# Patient Record
Sex: Male | Born: 1970 | Race: White | Hispanic: No | Marital: Married | Smoking: Former smoker
Health system: Southern US, Community
[De-identification: ages and names within clinical notes are randomized; demographics above are authoritative.]

## PROBLEM LIST (undated history)

## (undated) DIAGNOSIS — M48061 Spinal stenosis, lumbar region without neurogenic claudication: Secondary | ICD-10-CM

## (undated) DIAGNOSIS — G8929 Other chronic pain: Secondary | ICD-10-CM

## (undated) DIAGNOSIS — I2510 Atherosclerotic heart disease of native coronary artery without angina pectoris: Secondary | ICD-10-CM

## (undated) DIAGNOSIS — M545 Low back pain, unspecified: Secondary | ICD-10-CM

## (undated) DIAGNOSIS — I10 Essential (primary) hypertension: Secondary | ICD-10-CM

## (undated) DIAGNOSIS — J302 Other seasonal allergic rhinitis: Secondary | ICD-10-CM

## (undated) DIAGNOSIS — F419 Anxiety disorder, unspecified: Secondary | ICD-10-CM

## (undated) DIAGNOSIS — Z91030 Bee allergy status: Secondary | ICD-10-CM

## (undated) HISTORY — DX: Anxiety disorder, unspecified: F41.9

---

## 2013-11-23 ENCOUNTER — Ambulatory Visit (INDEPENDENT_AMBULATORY_CARE_PROVIDER_SITE_OTHER): Payer: BC Managed Care – PPO | Admitting: Family Medicine

## 2013-11-23 ENCOUNTER — Ambulatory Visit (INDEPENDENT_AMBULATORY_CARE_PROVIDER_SITE_OTHER): Payer: BC Managed Care – PPO

## 2013-11-23 VITALS — BP 139/81 | HR 80 | Temp 98.3°F | Resp 18 | Ht 72.0 in | Wt 201.0 lb

## 2013-11-23 DIAGNOSIS — E860 Dehydration: Secondary | ICD-10-CM

## 2013-11-23 DIAGNOSIS — R0602 Shortness of breath: Secondary | ICD-10-CM

## 2013-11-23 DIAGNOSIS — M940 Chondrocostal junction syndrome [Tietze]: Secondary | ICD-10-CM

## 2013-11-23 DIAGNOSIS — F411 Generalized anxiety disorder: Secondary | ICD-10-CM

## 2013-11-23 DIAGNOSIS — R079 Chest pain, unspecified: Secondary | ICD-10-CM

## 2013-11-23 DIAGNOSIS — R319 Hematuria, unspecified: Secondary | ICD-10-CM

## 2013-11-23 DIAGNOSIS — K219 Gastro-esophageal reflux disease without esophagitis: Secondary | ICD-10-CM

## 2013-11-23 LAB — POCT URINALYSIS DIPSTICK
Bilirubin, UA: NEGATIVE
GLUCOSE UA: NEGATIVE
Leukocytes, UA: NEGATIVE
Nitrite, UA: NEGATIVE
PROTEIN UA: NEGATIVE
Spec Grav, UA: 1.02
Urobilinogen, UA: 0.2
pH, UA: 5.5

## 2013-11-23 LAB — POCT SEDIMENTATION RATE: POCT SED RATE: 18 mm/hr (ref 0–22)

## 2013-11-23 LAB — POCT CBC
Granulocyte percent: 66.8 %G (ref 37–80)
HEMATOCRIT: 47.2 % (ref 43.5–53.7)
Hemoglobin: 15.3 g/dL (ref 14.1–18.1)
LYMPH, POC: 1.5 (ref 0.6–3.4)
MCH, POC: 29.8 pg (ref 27–31.2)
MCHC: 32.4 g/dL (ref 31.8–35.4)
MCV: 92 fL (ref 80–97)
MID (cbc): 0.4 (ref 0–0.9)
MPV: 8.7 fL (ref 0–99.8)
POC Granulocyte: 3.9 (ref 2–6.9)
POC LYMPH %: 25.9 % (ref 10–50)
POC MID %: 7.3 %M (ref 0–12)
Platelet Count, POC: 221 10*3/uL (ref 142–424)
RBC: 5.13 M/uL (ref 4.69–6.13)
RDW, POC: 12.9 %
WBC: 5.9 10*3/uL (ref 4.6–10.2)

## 2013-11-23 LAB — COMPLETE METABOLIC PANEL WITH GFR
ALT: 23 U/L (ref 0–53)
AST: 20 U/L (ref 0–37)
Albumin: 4.7 g/dL (ref 3.5–5.2)
Alkaline Phosphatase: 67 U/L (ref 39–117)
BUN: 17 mg/dL (ref 6–23)
CO2: 29 meq/L (ref 19–32)
Calcium: 9.2 mg/dL (ref 8.4–10.5)
Chloride: 102 mEq/L (ref 96–112)
Creat: 0.96 mg/dL (ref 0.50–1.35)
GFR, Est Non African American: >89 mL/min
GLUCOSE: 92 mg/dL (ref 70–99)
POTASSIUM: 4.6 meq/L (ref 3.5–5.3)
SODIUM: 140 meq/L (ref 135–145)
TOTAL PROTEIN: 7.3 g/dL (ref 6.0–8.3)
Total Bilirubin: 0.6 mg/dL (ref 0.2–1.2)

## 2013-11-23 LAB — POCT UA - MICROSCOPIC ONLY
BACTERIA, U MICROSCOPIC: NEGATIVE
CRYSTALS, UR, HPF, POC: NEGATIVE
Casts, Ur, LPF, POC: NEGATIVE
Epithelial cells, urine per micros: NEGATIVE
Mucus, UA: POSITIVE
Yeast, UA: NEGATIVE

## 2013-11-23 LAB — GLUCOSE, POCT (MANUAL RESULT ENTRY): POC GLUCOSE: 71 mg/dL (ref 70–99)

## 2013-11-23 MED ORDER — ALBUTEROL SULFATE (2.5 MG/3ML) 0.083% IN NEBU
2.5000 mg | INHALATION_SOLUTION | Freq: Once | RESPIRATORY_TRACT | Status: AC
  Administered 2013-11-23: 2.5 mg via RESPIRATORY_TRACT

## 2013-11-23 MED ORDER — HYDROCOD POLST-CHLORPHEN POLST 10-8 MG/5ML PO LQCR: 5.0000 mL | Freq: Every evening | ORAL | Status: DC | PRN

## 2013-11-23 MED ORDER — MELOXICAM 15 MG PO TABS: 15.0000 mg | ORAL_TABLET | Freq: Every day | ORAL | Status: DC

## 2013-11-23 MED ORDER — GUAIFENESIN-DM 100-10 MG/5ML PO SYRP: 5.0000 mL | ORAL_SOLUTION | ORAL | Status: DC | PRN

## 2013-11-23 MED ORDER — RANITIDINE HCL 150 MG PO TABS: 150.0000 mg | ORAL_TABLET | Freq: Two times a day (BID) | ORAL | Status: DC

## 2013-11-23 MED ORDER — CETIRIZINE HCL 10 MG PO TABS: 10.0000 mg | ORAL_TABLET | Freq: Every day | ORAL | Status: DC

## 2013-11-23 MED ORDER — BUSPIRONE HCL 10 MG PO TABS: 10.0000 mg | ORAL_TABLET | Freq: Two times a day (BID) | ORAL | Status: DC

## 2013-11-23 NOTE — Patient Instructions (Addendum)
Dehydration in Sports The body uses the kidney, bladder, and thirst mechanisms in an intricate system to maintain the proper fluid levels in the body. When this system is stressed, such as during exercise, the system may not be able to maintain these levels. This results in the body lacking water (dehydration). Dehydration can be a problem because the body requires a certain amount of water and other fluids to maintain its blood volume. Fluid is lost when you urinate, sweat, breathe, vomit, or have diarrhea. Dehydration occurs when you drink less fluid than you lose. Dehydration may occur even before you become thirsty. It is important for athletes to keep drinking during activity, even if they do not feel thirsty. SYMPTOMS   Thirst.  Dry mouth.  Tiredness (lethargy).  Dark urine.  Headache.  Muscle cramps.  Rapid breathing.  Lightheadedness, especially when you stand from a sitting position.  Dry, warm skin.  Little or no urination.  Low blood pressure.  Fainting (syncope).  Delirium or unconsciousness. RISK INCREASES WITH:  Diarrhea.  Vomiting.  Inadequate fluid intake during an illness or strenuous exercise.  Inadequate food intake during an illness, during strenuous exercise, or after strenuous exercise.  Use of diuretic medicines, which control excess body fluid by causing fluid loss.  Certain age groups. Infants and the elderly are at greater risk. PREVENTION  Drink frequently throughout physical activity even if you do not feel thirsty. Drink small amounts of fluid frequently throughout and after sporting events.  Drink extra fluids to keep up with any ongoing losses (sweating, diarrhea).  Carry extra water and the ingredients for making an oral rehydration solution (ORS).  If you have diarrhea or vomiting or you are not drinking much, force yourself to drink more liquids before you become dehydrated. RELATED COMPLICATIONS   Reduced ability to dissipate  heat, resulting in elevated core body temperatures.  Heat illness.  Heat stroke.  Kidney failure. TREATMENT Mild dehydration is treated by drinking enough fluid to replace the fluids you have lost. You may also need to replace the electrolytes you have lost. Recommendations for replenishing fluids and electrolytes include drinking sips of water slowly, eating foods with salt, drinking sports drinks, or taking over-the-counter dehydration medicines. Treat dehydration immediately. Do not wait until dehydration becomes severe.  Packets of ORS are widely available. Follow the directions on the packet. If no instructions are given, mix the contents of the ORS with 1 quart or liter of drinking water. If you are not sure if the water is safe to drink, first boil the water for at least 5 minutes.  If you are unable to obtain ORS you may create your own by adding 2 tbs of sugar or honey,  tsp salt, and  tsp baking soda to 1 quart or liter of water. If you do not have any baking soda, add another  tsp of salt. If possible, add  cup orange juice or some mashed banana to improve the taste and provide some potassium. Drink sips of the ORS every 5 minutes until urination becomes normal. It is normal to urinate 4 or 5 times a day. Adults and adolescents should drink at least 3 quarts or liters of ORS a day until they are well. For individuals who are vomiting or have diarrhea, it is important to keep trying to drink the ORS. Your body may retain some of the fluids and salts you need even if you are vomiting or have diarrhea. Remember to take only sips of liquids. Chilling   the ORS may help. Severe dehydration is a medical emergency. If you have symptoms of severe dehydration, seek medical care immediately. It may be necessary for you to receive intravenous (IV) fluids. If you are able to drink, you should also drink the ORS. With treatment for dehydration, whatever is causing diarrhea, vomiting, or other symptoms  should also be treated.  Document Released: 05/27/2005 Document Revised: 08/19/2011 Document Reviewed: 09/08/2008 [facility] Patient Information 2014 [employer], [place]. Acute Bronchitis Bronchitis is inflammation of the airways that extend from the windpipe into the lungs (bronchi). The inflammation often causes mucus to develop. This leads to a cough, which is the most common symptom of bronchitis.  In acute bronchitis, the condition usually develops suddenly and goes away over time, usually in a couple weeks. Smoking, allergies, and asthma can make bronchitis worse. Repeated episodes of bronchitis may cause further lung problems.  CAUSES Acute bronchitis is most often caused by the same virus that causes a cold. The virus can spread from person to person (contagious).  SIGNS AND SYMPTOMS   Cough.   Fever.   Coughing up mucus.   Body aches.   Chest congestion.   Chills.   Shortness of breath.   Sore throat.  DIAGNOSIS  Acute bronchitis is usually diagnosed through a physical exam. Tests, such as chest X-rays, are sometimes done to rule out other conditions.  TREATMENT  Acute bronchitis usually goes away in a couple weeks. Often times, no medical treatment is necessary. Medicines are sometimes given for relief of fever or cough. Antibiotics are usually not needed but may be prescribed in certain situations. In some cases, an inhaler may be recommended to help reduce shortness of breath and control the cough. A cool mist vaporizer may also be used to help thin bronchial secretions and make it easier to clear the chest.  HOME CARE INSTRUCTIONS  Get plenty of rest.   Drink enough fluids to keep your urine clear or pale yellow (unless you have a medical condition that requires fluid restriction). Increasing fluids may help thin your secretions and will prevent dehydration.   Only take over-the-counter or prescription medicines as directed by your health care Dailynn Nancarrow.   Avoid  smoking and secondhand smoke. Exposure to cigarette smoke or irritating chemicals will make bronchitis worse. If you are a smoker, consider using nicotine gum or skin patches to help control withdrawal symptoms. Quitting smoking will help your lungs heal faster.   Reduce the chances of another bout of acute bronchitis by washing your hands frequently, avoiding people with cold symptoms, and trying not to touch your hands to your mouth, nose, or eyes.   Follow up with your health care Coti Burd as directed.  SEEK MEDICAL CARE IF: Your symptoms do not improve after 1 week of treatment.  SEEK IMMEDIATE MEDICAL CARE IF:  You develop an increased fever or chills.   You have chest pain.   You have severe shortness of breath.  You have bloody sputum.   You develop dehydration.  You develop fainting.  You develop repeated vomiting.  You develop a severe headache. MAKE SURE YOU:   Understand these instructions.  Will watch your condition.  Will get help right away if you are not doing well or get worse. Document Released: 07/04/2004 Document Revised: 01/27/2013 Document Reviewed: 11/17/2012 [facility] Patient Information 2014 [employer], [place].

## 2013-11-23 NOTE — Progress Notes (Deleted)
Subjective:   This chart was scribed for Timothy Sorenson, MD, by Timothy Charles, ED Scribe. This patient's care was started at 11:14 AM.    Patient ID: Timothy Charles, male    DOB: [birth year], 43 y.o.   MRN: [id number]  Chief Complaint  Patient presents with  . sore in middle of chest    x1 week  . Anxiety    last night     HPI  Timothy Charles is a 43 y.o. male who presents to [facility] complaining of thirty minutes of chest pain which occurred yesterday evening as the pt laid in bed. Mr. Timothy Charles reports he has experienced similar chest pain intermittently for the past week; the episodes resolve spontaneously. He characterizes the chest pain as "sharp" and "smothering." The pt also characterizes the sensation as "having water in my chest"; he associates the current sensation of being water-logged to prior episodes of bronchitis. He also reports central chest pain after coughing. He states yesterday evening he also experienced  back pain, neck pain, numbness to his arms bilaterally, lighheadedness, and difficulty breathing associated with the chest pain. He denies the sensation of the room spinning.  Mr. Timothy Charles reports a h/o anxiety; he reports his previous episodes of anxiety are similar to his current symptoms.   The pt reports increased stress associated with work; he works outside and the past few days have been hot. He also reports stress associated with a move four months ago from [place] to [place] with his wife. He has used Buspar for anxiety in the past with relief.   Additionally, Mr. Timothy Charles reports that last week he developed congestion and an intermittent cough productive of thick, clear mucus. He also voices acid reflux which began last week; he has a h/o acid reflux from several years ago. Yesterday, he experienced diarrhea and a sore throat; the diarrhea has resolved, but the sore throat has persisted. He also endorses a headache, otalgia, and a decreased appetite. Additionally,  he has had increased thirst due to his outside work environment; he drinks water and Gatorade. Furthermore, Mr. Timothy Charles states he noticed a rash to his right arm after visiting the amusement [place] [employer] three days ago.  The pt denies fever or chills. He also denies wheezing, urinary symptoms, and constipation.   He used Zantax yesterday evening to address his symptoms. He used an inhaler when in high school, 25 years ago.   Last week, he began using Hydroxycut and subsequently experienced heart racing. He ceased taking it.   He also reports a h/o chronic lower back pain for which he received IM medication, though he has not had any injections recently.  Additionally, he reports chronic bilateral lower extremity swelling which occurs regularly and resolves spontaneously.   He also states he experienced increased allergic symptoms after the move to [place] four months ago.   He reports he quit smoking last week when he developed a cough and congestion; he states he only smokes at work.   Past Medical History  Diagnosis Date  . Allergy   . Anxiety   . Hypertension    No current outpatient prescriptions on file prior to visit.   No current facility-administered medications on file prior to visit.   No Known Allergies   Review of Systems  Constitutional: Positive for appetite change. Negative for fever and chills.  HENT: Positive for congestion, ear pain, postnasal drip and sore throat.   Respiratory: Positive for cough and shortness of breath. Negative  for wheezing.   Cardiovascular: Positive for chest pain and leg swelling.  Gastrointestinal: Positive for diarrhea. Negative for constipation.  Genitourinary: Negative for dysuria, frequency, hematuria and decreased urine volume.  Skin: Positive for rash.  Neurological: Positive for light-headedness, numbness and headaches. Negative for dizziness.  Psychiatric/Behavioral: The patient is nervous/anxious.    Triage Vitals:  BP 110/72  Pulse 65  Temp(Src) 98.3 F (36.8 C) (Oral)  Resp 18  Ht 6' (1.829 m)  Wt 201 lb (91.173 kg)  BMI 27.25 kg/m2  SpO2 96%     Objective:   Physical Exam  Nursing note and vitals reviewed. Constitutional: He is oriented to person, place, and time. He appears well-developed and well-nourished. No distress.  HENT:  Head: Normocephalic and atraumatic.  Right Ear: Tympanic membrane normal. Tympanic membrane is not retracted and not bulging.  Left Ear: Tympanic membrane is retracted and bulging.  Nose: Mucosal edema present.  Mouth/Throat: Uvula is midline. No uvula swelling. No oropharyngeal exudate, posterior oropharyngeal edema or posterior oropharyngeal erythema.  Post nasal drip  Eyes: Conjunctivae and EOM are normal.  Neck: Neck supple. No tracheal deviation present.  Cardiovascular: Normal rate, regular rhythm, S1 normal, S2 normal, normal heart sounds and intact distal pulses.   No murmur heard. Pulses:      Dorsalis pedis pulses are 2+ on the right side, and 2+ on the left side.  Tenderness bilaterally over costosternal junction.   Pulmonary/Chest: Effort normal and breath sounds normal. No respiratory distress. He has no decreased breath sounds. He has no wheezes. He has no rhonchi. He has no rales.  Abdominal: Bowel sounds are normal. He exhibits no mass. There is no hepatosplenomegaly. There is tenderness in the right lower quadrant. There is no rebound and no guarding.  Mild RLQ pain.   Musculoskeletal: Normal range of motion. He exhibits no edema.  Lymphadenopathy:    He has cervical adenopathy.       Left cervical: Superficial cervical adenopathy present.  Neurological: He is alert and oriented to person, place, and time.  Skin: Skin is warm and dry.  Psychiatric: He has a normal mood and affect. His behavior is normal.   [facility] reading (PRIMARY) by  Timothy Charles. Chest x-ray normal. EXAM: CHEST 2 VIEW  COMPARISON: None.  FINDINGS: The cardiomediastinal  silhouette is within normal limits. The lungs are well inflated and clear. There is no evidence of pleural effusion or pneumothorax. No acute osseous abnormality is identified.  IMPRESSION: Unremarkable chest radiographs.   peak flow: 475, 450, and 550. The pt's peak flow is predicted at 650.   1:20 PM- Rechecked pt. O2 peak flow of 455 after albuterol treatment. Pt reports minimal improvement after the albuterol treatment. Exam unchanged  Results for orders placed in visit on 11/23/13  POCT CBC      Result Value Ref Range   WBC 5.9  4.6 - 10.2 K/uL   Lymph, poc 1.5  0.6 - 3.4   POC LYMPH PERCENT 25.9  10 - 50 %L   MID (cbc) 0.4  0 - 0.9   POC MID % 7.3  0 - 12 %M   POC Granulocyte 3.9  2 - 6.9   Granulocyte percent 66.8  37 - 80 %G   RBC 5.13  4.69 - 6.13 M/uL   Hemoglobin 15.3  14.1 - 18.1 g/dL   HCT, POC 16.147.2  09.643.5 - 53.7 %   MCV 92.0  80 - 97 fL   MCH, POC 29.8  27 -  31.2 pg   MCHC 32.4  31.8 - 35.4 g/dL   RDW, POC 40.912.9     Platelet Count, POC 221  142 - 424 K/uL   MPV 8.7  0 - 99.8 fL  GLUCOSE, POCT (MANUAL RESULT ENTRY)      Result Value Ref Range   POC Glucose 71  70 - 99 mg/dl  POCT UA - MICROSCOPIC ONLY      Result Value Ref Range   WBC, Ur, HPF, POC 0-2     RBC, urine, microscopic 0-1     Bacteria, U Microscopic neg     Mucus, UA positive     Epithelial cells, urine per micros neg     Crystals, Ur, HPF, POC neg     Casts, Ur, LPF, POC neg     Yeast, UA neg    POCT URINALYSIS DIPSTICK      Result Value Ref Range   Color, UA yellow     Clarity, UA clear     Glucose, UA neg     Bilirubin, UA neg     Ketones, UA trace     Spec Grav, UA 1.020     Blood, UA trace     pH, UA 5.5     Protein, UA neg     Urobilinogen, UA 0.2     Nitrite, UA neg     Leukocytes, UA Negative         Assessment & Plan:  Ordered a chest x-ray, EKG, lab work, and a breathing treatment. Will prescribe Buspar, Mobic, Mucinex, and Zantac. Will also prescribe a day cough  medication and an evening cough medication to use as needed.  Advised pt to drink plenty of fluids and avoid salt to address dehydration.  Encouraged pt to return in four days, on 11/27/13, to follow-up.   SOB (shortness of breath) - Plan: POCT CBC, POCT glucose (manual entry), POCT SEDIMENTATION RATE, POCT UA - Microscopic Only, POCT urinalysis dipstick, COMPLETE METABOLIC PANEL WITH GFR, DG Chest 2 View, EKG 12-Lead, albuterol (PROVENTIL) (2.5 MG/3ML) 0.083% nebulizer solution 2.5 mg  Chest pain - Plan: POCT CBC, POCT glucose (manual entry), POCT SEDIMENTATION RATE, POCT UA - Microscopic Only, POCT urinalysis dipstick, COMPLETE METABOLIC PANEL WITH GFR, DG Chest 2 View, EKG 12-Lead  Anxiety state, unspecified  GERD (gastroesophageal reflux disease)  Dehydration  Costochondritis  Hematuria  Meds ordered this encounter  Medications  . DISCONTD: busPIRone (BUSPAR) 10 MG tablet    Sig: Take 10 mg by mouth 2 (two) times daily.  . busPIRone (BUSPAR) 10 MG tablet    Sig: Take 1 tablet (10 mg total) by mouth 2 (two) times daily.    Dispense:  60 tablet    Refill:  2  . ranitidine (ZANTAC) 150 MG tablet    Sig: Take 1 tablet (150 mg total) by mouth 2 (two) times daily.    Dispense:  60 tablet    Refill:  2  . meloxicam (MOBIC) 15 MG tablet    Sig: Take 1 tablet (15 mg total) by mouth daily. Do not use with any other otc pain medication other than tylenol/acetaminophen    Dispense:  30 tablet    Refill:  1  . albuterol (PROVENTIL) (2.5 MG/3ML) 0.083% nebulizer solution 2.5 mg    Sig:   . chlorpheniramine-HYDROcodone (TUSSIONEX PENNKINETIC ER) 10-8 MG/5ML LQCR    Sig: Take 5 mLs by mouth at bedtime as needed.    Dispense:  60 mL  Refill:  0  . guaiFENesin-dextromethorphan (ROBITUSSIN DM) 100-10 MG/5ML syrup    Sig: Take 5 mLs by mouth every 4 (four) hours as needed for cough.    Dispense:  240 mL    Refill:  0  . cetirizine (ZYRTEC) 10 MG tablet    Sig: Take 1 tablet (10 mg  total) by mouth daily.    Dispense:  30 tablet    Refill:  1    I personally performed the services described in this documentation, which was scribed in my presence. The recorded information has been reviewed and considered, and addended by me as needed.  Timothy Sorenson, MD MPH

## 2013-11-24 ENCOUNTER — Encounter: Payer: Self-pay | Admitting: Family Medicine

## 2013-11-24 NOTE — Progress Notes (Addendum)
Subjective:   This chart was scribed for Norberto SorensonEva Shaw, MD, by Yevette EdwardsAngela Bracken, ED Scribe. This patient's care was started at 11:14 AM.    Patient ID: Timothy Charles, male    DOB: 04-20-1971, 43 y.o.   MRN: [id number]  Chief Complaint  Patient presents with  . sore in middle of chest    x1 week  . Anxiety    last night     Anxiety Symptoms include chest pain, nervous/anxious behavior and shortness of breath. Patient reports no dizziness.      Timothy Charles is a 43 y.o. male who [facility] complaining of thirty minutes of chest pain which occurred yesterday evening as the pt laid in bed. Timothy Charles reports he has experienced similar chest pain intermittently for the past week; the episodes resolve spontaneously. He characterizes the chest pain as "sharp" and "smothering." The pt also characterizes the sensation as "having water in my chest"; he associates the current sensation of being water-logged to prior episodes of bronchitis. He also reports central chest pain after coughing. He states yesterday evening he also experienced  back pain, neck pain, numbness to his arms bilaterally, lighheadedness, and difficulty breathing associated with the chest pain. He denies the sensation of the room spinning.  Timothy Charles reports a h/o anxiety; he reports his previous episodes of anxiety are similar to his current symptoms.   The pt reports increased stress associated with work; he works outside and the past few days have been hot. He also reports stress associated with a [place] with his [place]. He has used Buspar for anxiety in the past with relief.   Additionally, Timothy Charles reports that last week he developed congestion and an intermittent cough productive of thick, clear mucus. He also voices acid reflux which began last week; he has a h/o acid reflux from several years ago. Yesterday, he experienced diarrhea and a sore throat; the diarrhea has  resolved, but the sore throat has persisted. He also endorses a headache, otalgia, and a decreased appetite. Additionally, he has had increased thirst due to his outside work environment; he drinks water and Gatorade. Furthermore, Timothy Charles states he noticed a rash to his right arm after visiting the amusement [place] [employer] three days ago.  The pt denies fever or chills. He also denies wheezing, urinary symptoms, and constipation.   He used Zantax yesterday evening to address his symptoms. He used an inhaler when in high school, 25 years ago.   Last week, he began using Hydroxycut and subsequently experienced heart racing. He ceased taking it.   He also reports a h/o chronic lower back pain for which he received IM medication, though he has not had any injections recently.  Additionally, he reports chronic bilateral lower extremity swelling which occurs regularly and resolves spontaneously.   He also states he experienced increased allergic symptoms after the move to [place] four months ago.   He reports he quit smoking last week when he developed a cough and congestion; he states he only smokes at work.   Past Medical History  Diagnosis Date  . Allergy   . Anxiety   . Hypertension    No current outpatient prescriptions on file prior to visit.   No current facility-administered medications on file prior to visit.   No Known Allergies   Review of Systems  Constitutional: Positive for appetite change. Negative for fever and chills.  HENT: Positive for congestion,  ear pain, postnasal drip and sore throat.   Respiratory: Positive for cough and shortness of breath. Negative for wheezing.   Cardiovascular: Positive for chest pain and leg swelling.  Gastrointestinal: Positive for diarrhea. Negative for constipation.  Genitourinary: Negative for dysuria, frequency, hematuria and decreased urine volume.  Skin: Positive for rash.  Neurological: Positive for light-headedness,  numbness and headaches. Negative for dizziness.  Psychiatric/Behavioral: The patient is nervous/anxious.     Triage Vitals: BP 110/72  Pulse 65  Temp(Src) 98.3 F (36.8 C) (Oral)  Resp 18  Ht 6' (1.829 m)  Wt 201 lb (91.173 kg)  BMI 27.25 kg/m2  SpO2 96%    Objective:   Physical Exam  Nursing note and vitals reviewed. Constitutional: He is oriented to person, place, and time. He appears well-developed and well-nourished. No distress.  HENT:  Head: Normocephalic and atraumatic.  Right Ear: Tympanic membrane normal. Tympanic membrane is not retracted and not bulging.  Left Ear: Tympanic membrane is retracted and bulging.  Nose: Mucosal edema present.  Mouth/Throat: Uvula is midline. No uvula swelling. No oropharyngeal exudate, posterior oropharyngeal edema or posterior oropharyngeal erythema.  Post nasal drip  Eyes: Conjunctivae and EOM are normal.  Neck: Neck supple. No tracheal deviation present.  Cardiovascular: Normal rate, regular rhythm, S1 normal, S2 normal, normal heart sounds and intact distal pulses.   No murmur heard. Pulses:      Dorsalis pedis pulses are 2+ on the right side, and 2+ on the left side.  Tenderness bilaterally over costosternal junction.   Pulmonary/Chest: Effort normal and breath sounds normal. No respiratory distress. He has no decreased breath sounds. He has no wheezes. He has no rhonchi. He has no rales.  Abdominal: Bowel sounds are normal. He exhibits no mass. There is no hepatosplenomegaly. There is tenderness in the right lower quadrant. There is no rebound and no guarding.  Mild RLQ pain.   Musculoskeletal: Normal range of motion. He exhibits no edema.  Lymphadenopathy:    He has cervical adenopathy.       Left cervical: Superficial cervical adenopathy present.  Neurological: He is alert and oriented to person, place, and time.  Skin: Skin is warm and dry.  Psychiatric: He has a normal mood and affect. His behavior is normal.   [facility] reading  (PRIMARY) by  Dr. Clelia Croft. Chest x-ray normal. EXAM: CHEST 2 VIEW  COMPARISON: None.  FINDINGS: The cardiomediastinal silhouette is within normal limits. The lungs are well inflated and clear. There is no evidence of pleural effusion or pneumothorax. No acute osseous abnormality is identified.  IMPRESSION: Unremarkable chest radiographs.  EKG: NSR, no ischemic changes  peak flow: 475, 450, and 550. The pt's peak flow is predicted at 650.   1:20 PM- Rechecked pt. O2 peak flow of 455 after albuterol treatment. Pt reports minimal improvement after the albuterol treatment. Exam unchanged  Results for orders placed in visit on 11/23/13  POCT CBC      Result Value Ref Range   WBC 5.9  4.6 - 10.2 K/uL   Lymph, poc 1.5  0.6 - 3.4   POC LYMPH PERCENT 25.9  10 - 50 %L   MID (cbc) 0.4  0 - 0.9   POC MID % 7.3  0 - 12 %M   POC Granulocyte 3.9  2 - 6.9   Granulocyte percent 66.8  37 - 80 %G   RBC 5.13  4.69 - 6.13 M/uL   Hemoglobin 15.3  14.1 - 18.1 g/dL   HCT,  POC 47.2  43.5 - 53.7 %   MCV 92.0  80 - 97 fL   MCH, POC 29.8  27 - 31.2 pg   MCHC 32.4  31.8 - 35.4 g/dL   RDW, POC 40.912.9     Platelet Count, POC 221  142 - 424 K/uL   MPV 8.7  0 - 99.8 fL  GLUCOSE, POCT (MANUAL RESULT ENTRY)      Result Value Ref Range   POC Glucose 71  70 - 99 mg/dl  POCT UA - MICROSCOPIC ONLY      Result Value Ref Range   WBC, Ur, HPF, POC 0-2     RBC, urine, microscopic 0-1     Bacteria, U Microscopic neg     Mucus, UA positive     Epithelial cells, urine per micros neg     Crystals, Ur, HPF, POC neg     Casts, Ur, LPF, POC neg     Yeast, UA neg    POCT URINALYSIS DIPSTICK      Result Value Ref Range   Color, UA yellow     Clarity, UA clear     Glucose, UA neg     Bilirubin, UA neg     Ketones, UA trace     Spec Grav, UA 1.020     Blood, UA trace     pH, UA 5.5     Protein, UA neg     Urobilinogen, UA 0.2     Nitrite, UA neg     Leukocytes, UA Negative         Assessment & Plan:    SOB (shortness of breath) - Plan: POCT CBC, POCT glucose (manual entry), POCT SEDIMENTATION RATE, POCT UA - Microscopic Only, POCT urinalysis dipstick, COMPLETE METABOLIC PANEL WITH GFR, DG Chest 2 View, EKG 12-Lead, albuterol (PROVENTIL) (2.5 MG/3ML) 0.083% nebulizer solution 2.5 mg- no sig improvement after alb neb in office. Pt may be developing bronchitis - recheck in 4d, sooner if worse  Chest pain - Plan: POCT CBC, POCT glucose (manual entry), POCT SEDIMENTATION RATE, POCT UA - Microscopic Only, POCT urinalysis dipstick, COMPLETE METABOLIC PANEL WITH GFR, DG Chest 2 View, EKG 12-Lead  Anxiety state, unspecified - restart buspar  GERD (gastroesophageal reflux disease) - restart zantac  Dehydration  - push fluids, decrease salt  Costochondritis - start mobic  Hematuria - suspect due to dehydration, push fluids and recheck at f/u.   Meds ordered this encounter  Medications  . DISCONTD: busPIRone (BUSPAR) 10 MG tablet    Sig: Take 10 mg by mouth 2 (two) times daily.  . busPIRone (BUSPAR) 10 MG tablet    Sig: Take 1 tablet (10 mg total) by mouth 2 (two) times daily.    Dispense:  60 tablet    Refill:  2  . ranitidine (ZANTAC) 150 MG tablet    Sig: Take 1 tablet (150 mg total) by mouth 2 (two) times daily.    Dispense:  60 tablet    Refill:  2  . meloxicam (MOBIC) 15 MG tablet    Sig: Take 1 tablet (15 mg total) by mouth daily. Do not use with any other otc pain medication other than tylenol/acetaminophen    Dispense:  30 tablet    Refill:  1  . albuterol (PROVENTIL) (2.5 MG/3ML) 0.083% nebulizer solution 2.5 mg    Sig:   . chlorpheniramine-HYDROcodone (TUSSIONEX PENNKINETIC ER) 10-8 MG/5ML LQCR    Sig: Take 5 mLs by mouth at bedtime as needed.  Dispense:  60 mL    Refill:  0  . guaiFENesin-dextromethorphan (ROBITUSSIN DM) 100-10 MG/5ML syrup    Sig: Take 5 mLs by mouth every 4 (four) hours as needed for cough.    Dispense:  240 mL    Refill:  0  . cetirizine (ZYRTEC) 10  MG tablet    Sig: Take 1 tablet (10 mg total) by mouth daily.    Dispense:  30 tablet    Refill:  1    I personally performed the services described in this documentation, which was scribed in my presence. The recorded information has been reviewed and considered, and addended by me as needed.  Norberto Sorenson, MD MPH

## 2013-12-01 ENCOUNTER — Encounter: Payer: Self-pay | Admitting: Family Medicine

## 2013-12-01 NOTE — Progress Notes (Signed)
Phone number was out of order.  Sent the patient a letter to schedule an appointment in one week.

## 2014-01-28 ENCOUNTER — Encounter (HOSPITAL_COMMUNITY): Payer: Self-pay

## 2014-01-28 ENCOUNTER — Ambulatory Visit (INDEPENDENT_AMBULATORY_CARE_PROVIDER_SITE_OTHER): Payer: BC Managed Care – PPO | Admitting: Family Medicine

## 2014-01-28 ENCOUNTER — Ambulatory Visit (HOSPITAL_COMMUNITY)
Admission: RE | Admit: 2014-01-28 | Discharge: 2014-01-28 | Disposition: A | Discharge status: Home / Self Care | Payer: BC Managed Care – PPO | Source: Ambulatory Visit | Attending: Family Medicine | Admitting: Family Medicine

## 2014-01-28 VITALS — BP 162/92 | HR 70 | Temp 97.5°F | Resp 16 | Ht 71.0 in | Wt 206.6 lb

## 2014-01-28 DIAGNOSIS — H538 Other visual disturbances: Secondary | ICD-10-CM

## 2014-01-28 DIAGNOSIS — R51 Headache: Secondary | ICD-10-CM | POA: Diagnosis not present

## 2014-01-28 DIAGNOSIS — R42 Dizziness and giddiness: Secondary | ICD-10-CM

## 2014-01-28 LAB — POCT CBC
Granulocyte percent: 67.1 %G (ref 37–80)
HCT, POC: 45 % (ref 43.5–53.7)
Hemoglobin: 14.6 g/dL (ref 14.1–18.1)
Lymph, poc: 1.8 (ref 0.6–3.4)
MCH, POC: 29.3 pg (ref 27–31.2)
MCHC: 32.5 g/dL (ref 31.8–35.4)
MCV: 90.1 fL (ref 80–97)
MID (cbc): 0.2 (ref 0–0.9)
MPV: 7.6 fL (ref 0–99.8)
POC Granulocyte: 4 (ref 2–6.9)
POC LYMPH PERCENT: 30.2 %L (ref 10–50)
POC MID %: 2.7 %M (ref 0–12)
Platelet Count, POC: 183 10*3/uL (ref 142–424)
RBC: 4.99 M/uL (ref 4.69–6.13)
RDW, POC: 13.5 %
WBC: 5.9 10*3/uL (ref 4.6–10.2)

## 2014-01-28 LAB — POCT GLYCOSYLATED HEMOGLOBIN (HGB A1C): Hemoglobin A1C: 5.4

## 2014-01-28 LAB — GLUCOSE, POCT (MANUAL RESULT ENTRY): POC Glucose: 85 mg/dl (ref 70–99)

## 2014-01-28 MED ORDER — IOHEXOL 350 MG/ML SOLN
50.0000 mL | Freq: Once | INTRAVENOUS | Status: AC | PRN
  Administered 2014-01-28: 50 mL via INTRAVENOUS

## 2014-01-28 MED ORDER — PREDNISONE 20 MG PO TABS: ORAL_TABLET | ORAL | Status: DC

## 2014-01-28 NOTE — Patient Instructions (Addendum)
Go to [facility] and register at Radiology for outpatient CT Ang[place].  Driving directions to [facility] 3D2D  [phone]  - more info    [address]  [place], [place] [address]     1. Head south on [address] toward [address]      0.5 mi    2. Sharp left onto [address]      0.6 mi    3. Turn left onto the [address] ramp      0.2 mi    4. Merge onto [address]      3.0 mi    5. Continue straight to stay on [address]      0.4 mi    6. Slight left to stay on [address]      1.2 mi    7. Turn right onto [address]      0.1 mi    8. Turn left onto [address]      361 ft    9. Take the 1st left onto [address]  Destination will be on the right    Driving directions to [facility] 3D2D  [phone]  - more info    [address]  [place], [place] [address]     1. Head north on [address] toward [address]      344 ft    2. Turn right onto [address]      0.3 mi    3. Slight left to stay on [address]      1.7 mi    4. Turn left onto [address]  Destination will be on the right     0.6 mi     [facility]  [address]   Driving directions to [address], [place], [place] [address] 3D2D  - more info    [address]  [place], [place] [address]     1. Head south on [address] toward [address]      0.5 mi    2. Sharp left onto [address]      0.6 mi    3. Turn left onto the [address] ramp      0.2 mi    4. Merge onto [address]      3.0 mi    5. Continue straight to stay on [address]      0.4 mi    6. Slight left to stay on [address]  Destination will be on the right     1.0 mi     [address]  [place], [place] [address]   Driving directions to [facility] 3D2D  [phone]  - more info    [address]  [place], [place] [address]     1. Head south on [address] toward [address]      0.5 mi    2. Sharp left onto [address]      0.6 mi    3. Turn left onto the [address] ramp      0.2 mi    4. Merge onto [address]      3.0 mi    5. Slight right toward [address] (signs for [address])  0.2 mi    6. Take the ramp to [address] [employer] [address]      338 ft    7. Turn left onto [address]      0.3 mi    8. Turn left onto [place] will be on the right     0.2 mi     [facility]  [place] to [facility] 3D2D  - more info    [place], [place] [address]     1. Head south on [address] toward [address]      0.7 mi    2. Turn left onto [address]      0.4 mi    3. Take the 3rd right onto [facility] [address]      1.1 mi    4. Take the [address] ramp to [facility]      0.2 mi    5. Merge onto [address]      3.7 mi    6. Take exit [address] for [address] toward [place]      0.3 mi    7. Keep left at the fork, follow signs for Airport      381 ft    8. Keep left at the fork, follow signs for [facility]/[place]      302 ft    9. Turn left onto [address]      2.6 mi    10. Turn right onto [address] will be on the left     0.2 mi     [employer]

## 2014-01-28 NOTE — Progress Notes (Signed)
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  It was associated with sharp stabbing central chest pain and dizziness when he was a passenger.  Total episode lasted 2-3 minutes.  No prior problems  Last two or three days he's had "nasty" headaches.  He took 2 Naproxen which relieved headache  He awoke with blurry vision and headache today.  Both eye affected with blurriness.  Headache is behind the eyes.   F/H blood pressure, sugar, heart disease.  PMHx:  Right  Eye is "lazy"  ROS:  Occasional foot swelling. Slight soreness LLQ abdomen No further chest pain or shortness of breath  Social:  Working long hours all week.  Objective:  Lying supine and in no acute distress HEENT:  Right  Exophoria.  PERLA, No nystagmus TM's:  Normal Oroph:  Clear Neck: supple, no adenop or thyromegaly Chest:  Clear Heart:  Reg, no murmur Abdomen:  Soft, no guarding or rebound, slight tenderness RLQ   Results for orders placed in visit on 01/28/14  GLUCOSE, POCT (MANUAL RESULT ENTRY)      Result Value Ref Range   POC Glucose 85  70 - 99 mg/dl  POCT CBC      Result Value Ref Range   WBC 5.9  4.6 - 10.2 K/uL   Lymph, poc 1.8  0.6 - 3.4   POC LYMPH PERCENT 30.2  10 - 50 %L   MID (cbc) 0.2  0 - 0.9   POC MID % 2.7  0 - 12 %M   POC Granulocyte 4.0  2 - 6.9   Granulocyte percent 67.1  37 - 80 %G   RBC 4.99  4.69 - 6.13 M/uL   Hemoglobin 14.6  14.1 - 18.1 g/dL   HCT, POC 40.945.0  81.143.5 - 53.7 %   MCV 90.1  80 - 97 fL   MCH, POC 29.3  27 - 31.2 pg   MCHC 32.5  31.8 - 35.4 g/dL   RDW, POC 91.413.5     Platelet Count, POC 183  142 - 424 K/uL   MPV 7.6  0 - 99.8 fL  POCT GLYCOSYLATED HEMOGLOBIN (HGB A1C)      Result Value Ref Range   Hemoglobin A1C 5.4     Assessment: Atypical symptoms which may be related to heat exhaustion or viral illness. Nevertheless with these blurry vision symptoms, CT scan is indicated.  Plan: Out  of work today, CT scan, cemented pending  Symptoms persist, MRI will be necessary.  Signed, Elvina SidleKurt Menno Vanbergen M.D.  Addendum: CT with contrast normal I believe this may represent a migraine variant.  Prednisone ordered.  Patient to call if sx continue, in which case neurology referral indicated

## 2014-01-29 LAB — COMPREHENSIVE METABOLIC PANEL
ALT: 22 U/L (ref 0–53)
AST: 18 U/L (ref 0–37)
Albumin: 4.7 g/dL (ref 3.5–5.2)
Alkaline Phosphatase: 63 U/L (ref 39–117)
BUN: 16 mg/dL (ref 6–23)
CO2: 29 mEq/L (ref 19–32)
Calcium: 9 mg/dL (ref 8.4–10.5)
Chloride: 102 mEq/L (ref 96–112)
Creat: 0.99 mg/dL (ref 0.50–1.35)
Glucose, Bld: 98 mg/dL (ref 70–99)
Potassium: 4.4 mEq/L (ref 3.5–5.3)
Sodium: 140 mEq/L (ref 135–145)
Total Bilirubin: 0.6 mg/dL (ref 0.2–1.2)
Total Protein: 7.2 g/dL (ref 6.0–8.3)

## 2014-03-05 ENCOUNTER — Ambulatory Visit (INDEPENDENT_AMBULATORY_CARE_PROVIDER_SITE_OTHER): Payer: BC Managed Care – PPO | Admitting: Family Medicine

## 2014-03-05 VITALS — BP 136/78 | HR 63 | Temp 97.5°F | Resp 16 | Ht 71.25 in | Wt 210.4 lb

## 2014-03-05 DIAGNOSIS — H9201 Otalgia, right ear: Secondary | ICD-10-CM

## 2014-03-05 DIAGNOSIS — K029 Dental caries, unspecified: Secondary | ICD-10-CM

## 2014-03-05 DIAGNOSIS — K047 Periapical abscess without sinus: Secondary | ICD-10-CM

## 2014-03-05 DIAGNOSIS — K089 Disorder of teeth and supporting structures, unspecified: Secondary | ICD-10-CM

## 2014-03-05 DIAGNOSIS — H9209 Otalgia, unspecified ear: Secondary | ICD-10-CM

## 2014-03-05 DIAGNOSIS — K0889 Other specified disorders of teeth and supporting structures: Secondary | ICD-10-CM

## 2014-03-05 MED ORDER — HYDROCODONE-ACETAMINOPHEN 5-325 MG PO TABS: 1.0000 | ORAL_TABLET | Freq: Four times a day (QID) | ORAL | Status: DC | PRN

## 2014-03-05 MED ORDER — AMOXICILLIN 875 MG PO TABS: 875.0000 mg | ORAL_TABLET | Freq: Two times a day (BID) | ORAL | Status: DC

## 2014-03-05 NOTE — Addendum Note (Signed)
Addended by: Kaylia Winborne H on: 03/05/2014 11:28 AM   Modules accepted: Orders

## 2014-03-05 NOTE — Progress Notes (Signed)
Subjective: 43 year old man who is here with right ear pain and dental pain. He says he lost a filling about 3 months ago. He has been working long hours, 70 hours a week, and has not gotten into a dentist. Recently has been hurting much more. The pain now is into his jaw and ear. He is not on regular prescription medications.  Objective: TMs are normal. No inflammation of the ear or canal. He has tenderness of the submandibular nodes and a angle of the right jaw. His throat is clear without erythema he has several teeth missing. He has a filling out of the posterior molar on the lower right. This is quite tender, especially medially remainder aspect of the jaw with some erythema there. No defined fluctuance could be felt.  Assessment: Probable early dental abscess Dental pain Right otalgia Caries   Plan: Suggested a dentist, but he may need to just call around to other offices if not able to get in there. Take amoxicillin one twice daily for 10 days. Use ibuprofen or Aleve for milder pain. Use the Norco 5/325 one every 4-6 hours for severe pain only. Advise that he get the tooth fixed as soon as possible.

## 2014-03-05 NOTE — Patient Instructions (Signed)
Recommend seeing a dentist soon.  Suggest try Dr. Orma Flaming on [address]  [phone]  Take amoxicillin 875 one twice daily for 10 days  Use ibuprofen or Aleve as needed for pain  Use Norco 5/325 one every 4-6 hours for severe pain only. This is a controlled substance and can be abuse and addictive if taken too often.

## 2014-11-26 ENCOUNTER — Emergency Department (HOSPITAL_COMMUNITY)
Admission: EM | Admit: 2014-11-26 | Discharge: 2014-11-26 | Disposition: A | Discharge status: Home / Self Care | Payer: BLUE CROSS/BLUE SHIELD | Attending: Emergency Medicine | Admitting: Emergency Medicine

## 2014-11-26 ENCOUNTER — Encounter (HOSPITAL_COMMUNITY): Payer: Self-pay | Admitting: *Deleted

## 2014-11-26 DIAGNOSIS — J209 Acute bronchitis, unspecified: Secondary | ICD-10-CM | POA: Diagnosis not present

## 2014-11-26 DIAGNOSIS — Z87891 Personal history of nicotine dependence: Secondary | ICD-10-CM | POA: Insufficient documentation

## 2014-11-26 DIAGNOSIS — Z7951 Long term (current) use of inhaled steroids: Secondary | ICD-10-CM | POA: Diagnosis not present

## 2014-11-26 DIAGNOSIS — J40 Bronchitis, not specified as acute or chronic: Secondary | ICD-10-CM

## 2014-11-26 DIAGNOSIS — I10 Essential (primary) hypertension: Secondary | ICD-10-CM | POA: Insufficient documentation

## 2014-11-26 DIAGNOSIS — R05 Cough: Secondary | ICD-10-CM | POA: Diagnosis present

## 2014-11-26 MED ORDER — HYDROCOD POLST-CPM POLST ER 10-8 MG/5ML PO SUER: 5.0000 mL | Freq: Two times a day (BID) | ORAL | Status: DC | PRN

## 2014-11-26 MED ORDER — AZITHROMYCIN 250 MG PO TABS: ORAL_TABLET | ORAL | Status: DC

## 2014-11-26 NOTE — ED Notes (Signed)
Pt c/o cough and congestion x 2 days; pt states that he cough up clear phlegm at times; no difficulty breathing; pt states that he normally gets bronchitis once a year and that this is what this feels like

## 2014-11-26 NOTE — ED Provider Notes (Signed)
CSN: [id number]     Arrival date & time 11/26/14  0630 History   First MD Initiated Contact with Patient 11/26/14 [phone]     Chief Complaint  Patient presents with  . URI     (Consider location/radiation/quality/duration/timing/severity/associated sxs/prior Treatment) Patient is a 44 y.o. male presenting with URI. The history is provided by the patient (the pt complains of a cough).  URI Presenting symptoms: cough   Presenting symptoms: no congestion and no fatigue   Severity:  Moderate Onset quality:  Gradual Timing:  Constant Progression:  Waxing and waning Chronicity:  New Associated symptoms: no headaches     Past Medical History  Diagnosis Date  . Allergy   . Anxiety   . Hypertension    History reviewed. No pertinent past surgical history. Family History  Problem Relation Age of Onset  . Cancer Mother   . Diabetes Mother   . Heart disease Mother   . Hyperlipidemia Mother   . Hypertension Mother   . Stroke Mother   . Diabetes Brother   . Heart disease Brother   . Hyperlipidemia Brother   . Stroke Brother   . Diabetes Brother   . Hyperlipidemia Brother   . Hypertension Brother    History  Substance Use Topics  . Smoking status: Former Games developer  . Smokeless tobacco: Not on file  . Alcohol Use: No    Review of Systems  Constitutional: Negative for appetite change and fatigue.  HENT: Negative for congestion, ear discharge and sinus pressure.   Eyes: Negative for discharge.  Respiratory: Positive for cough.   Cardiovascular: Negative for chest pain.  Gastrointestinal: Negative for abdominal pain and diarrhea.  Genitourinary: Negative for frequency and hematuria.  Musculoskeletal: Negative for back pain.  Skin: Negative for rash.  Neurological: Negative for seizures and headaches.  Psychiatric/Behavioral: Negative for hallucinations.      Allergies  Review of patient's allergies indicates no known allergies.  Home Medications   Prior to Admission  medications   Medication Sig Start Date End Date Taking? Authorizing Provider  fluticasone (FLONASE) 50 MCG/ACT nasal spray Place 1 spray into both nostrils daily.   Yes Historical Provider, MD  amoxicillin (AMOXIL) 875 MG tablet Take 1 tablet (875 mg total) by mouth 2 (two) times daily. Patient not taking: Reported on 11/26/2014 03/05/14   Peyton Najjar, MD  azithromycin (ZITHROMAX Z-PAK) 250 MG tablet 2 po day one, then 1 daily x 4 days 11/26/14   Bethann Berkshire, MD  chlorpheniramine-HYDROcodone [facility]) 10-8 MG/5ML SUER Take 5 mLs by mouth every 12 (twelve) hours as needed for cough. 11/26/14   Bethann Berkshire, MD  HYDROcodone-acetaminophen (NORCO) 5-325 MG per tablet Take 1 tablet by mouth every 6 (six) hours as needed. Patient not taking: Reported on 11/26/2014 03/05/14   Peyton Najjar, MD  predniSONE (DELTASONE) 20 MG tablet Take two tablets a day for five days Patient not taking: Reported on 11/26/2014 01/28/14   Elvina Sidle, MD   BP 158/90 mmHg  Pulse 67  Temp(Src) 97.8 F (36.6 C) (Oral)  Resp 18  SpO2 96% Physical Exam  Constitutional: He is oriented to person, place, and time. He appears well-developed.  HENT:  Head: Normocephalic.  Eyes: Conjunctivae and EOM are normal. No scleral icterus.  Neck: Neck supple. No thyromegaly present.  Cardiovascular: Normal rate and regular rhythm.  Exam reveals no gallop and no friction rub.   No murmur heard. Pulmonary/Chest: No stridor. He has no wheezes. He has no rales.  He exhibits no tenderness.  Abdominal: He exhibits no distension. There is no tenderness. There is no rebound.  Musculoskeletal: Normal range of motion. He exhibits no edema.  Lymphadenopathy:    He has no cervical adenopathy.  Neurological: He is oriented to person, place, and time. He exhibits normal muscle tone. Coordination normal.  Skin: No rash noted. No erythema.  Psychiatric: He has a normal mood and affect. His behavior is normal.    ED Course   Procedures (including critical care time) Labs Review Labs Reviewed - No data to display  Imaging Review No results found.   EKG Interpretation None      MDM   Final diagnoses:  Bronchitis    Bronchitis,  tx with zpak and tussionex    Bethann Berkshire, MD 11/26/14 [phone]

## 2014-11-26 NOTE — Discharge Instructions (Signed)
Drink plenty of fluids.   Follow up if not improving. °

## 2015-05-04 ENCOUNTER — Encounter (HOSPITAL_COMMUNITY): Payer: Self-pay | Admitting: Physical Medicine and Rehabilitation

## 2015-05-04 ENCOUNTER — Emergency Department (HOSPITAL_COMMUNITY)
Admission: EM | Admit: 2015-05-04 | Discharge: 2015-05-04 | Disposition: A | Discharge status: Home / Self Care | Payer: BLUE CROSS/BLUE SHIELD | Attending: Emergency Medicine | Admitting: Emergency Medicine

## 2015-05-04 ENCOUNTER — Emergency Department (HOSPITAL_COMMUNITY): Payer: BLUE CROSS/BLUE SHIELD

## 2015-05-04 DIAGNOSIS — R05 Cough: Secondary | ICD-10-CM | POA: Diagnosis present

## 2015-05-04 DIAGNOSIS — Z87891 Personal history of nicotine dependence: Secondary | ICD-10-CM | POA: Diagnosis not present

## 2015-05-04 DIAGNOSIS — B9789 Other viral agents as the cause of diseases classified elsewhere: Secondary | ICD-10-CM

## 2015-05-04 DIAGNOSIS — F419 Anxiety disorder, unspecified: Secondary | ICD-10-CM | POA: Insufficient documentation

## 2015-05-04 DIAGNOSIS — Z7951 Long term (current) use of inhaled steroids: Secondary | ICD-10-CM | POA: Diagnosis not present

## 2015-05-04 DIAGNOSIS — I10 Essential (primary) hypertension: Secondary | ICD-10-CM | POA: Insufficient documentation

## 2015-05-04 DIAGNOSIS — J069 Acute upper respiratory infection, unspecified: Secondary | ICD-10-CM | POA: Diagnosis not present

## 2015-05-04 MED ORDER — PSEUDOEPHEDRINE HCL ER 120 MG PO TB12: 120.0000 mg | ORAL_TABLET | Freq: Two times a day (BID) | ORAL | Status: DC

## 2015-05-04 MED ORDER — BENZONATATE 100 MG PO CAPS: 100.0000 mg | ORAL_CAPSULE | Freq: Three times a day (TID) | ORAL | Status: DC

## 2015-05-04 MED ORDER — GUAIFENESIN-CODEINE 100-10 MG/5ML PO SOLN
10.0000 mL | Freq: Once | ORAL | Status: AC
  Administered 2015-05-04: 10 mL via ORAL
  Filled 2015-05-04: qty 10

## 2015-05-04 MED ORDER — PREDNISONE 10 MG PO TABS: 60.0000 mg | ORAL_TABLET | Freq: Every day | ORAL | Status: DC

## 2015-05-04 NOTE — ED Notes (Signed)
Pt presents to department for evaluation of productive cough with yellow sputum, sinus congestion and fatigue. Ongoing x2 days. Respirations unlabored. Reports unable to sleep due to coughing. Pt is alert and oriented x4. NAD

## 2015-05-04 NOTE — Discharge Instructions (Signed)
Upper Respiratory Infection, Adult °Most upper respiratory infections (URIs) are a viral infection of the air passages leading to the lungs. A URI affects the nose, throat, and upper air passages. The most common type of URI is nasopharyngitis and is typically referred to as "the common cold." °URIs run their course and usually go away on their own. Most of the time, a URI does not require medical attention, but sometimes a bacterial infection in the upper airways can follow a viral infection. This is called a secondary infection. Sinus and middle ear infections are common types of secondary upper respiratory infections. °Bacterial pneumonia can also complicate a URI. A URI can worsen asthma and chronic obstructive pulmonary disease (COPD). Sometimes, these complications can require emergency medical care and may be life threatening.  °CAUSES °Almost all URIs are caused by viruses. A virus is a type of germ and can spread from one person to another.  °RISKS FACTORS °You may be at risk for a URI if:  °· You smoke.   °· You have chronic heart or lung disease. °· You have a weakened defense (immune) system.   °· You are very young or very old.   °· You have nasal allergies or asthma. °· You work in crowded or poorly ventilated areas. °· You work in health care facilities or schools. °SIGNS AND SYMPTOMS  °Symptoms typically develop 2-3 days after you come in contact with a cold virus. Most viral URIs last 7-10 days. However, viral URIs from the influenza virus (flu virus) can last 14-18 days and are typically more severe. Symptoms may include:  °· Runny or stuffy (congested) nose.   °· Sneezing.   °· Cough.   °· Sore throat.   °· Headache.   °· Fatigue.   °· Fever.   °· Loss of appetite.   °· Pain in your forehead, behind your eyes, and over your cheekbones (sinus pain). °· Muscle aches.   °DIAGNOSIS  °Your health care provider may diagnose a URI by: °· Physical exam. °· Tests to check that your symptoms are not due to  another condition such as: °· Strep throat. °· Sinusitis. °· Pneumonia. °· Asthma. °TREATMENT  °A URI goes away on its own with time. It cannot be cured with medicines, but medicines may be prescribed or recommended to relieve symptoms. Medicines may help: °· Reduce your fever. °· Reduce your cough. °· Relieve nasal congestion. °HOME CARE INSTRUCTIONS  °· Take medicines only as directed by your health care provider.   °· Gargle warm saltwater or take cough drops to comfort your throat as directed by your health care provider. °· Use a warm mist humidifier or inhale steam from a shower to increase air moisture. This may make it easier to breathe. °· Drink enough fluid to keep your urine clear or pale yellow.   °· Eat soups and other clear broths and maintain good nutrition.   °· Rest as needed.   °· Return to work when your temperature has returned to normal or as your health care provider advises. You may need to stay home longer to avoid infecting others. You can also use a face mask and careful hand washing to prevent spread of the virus. °· Increase the usage of your inhaler if you have asthma.   °· Do not use any tobacco products, including cigarettes, chewing tobacco, or electronic cigarettes. If you need help quitting, ask your health care provider. °PREVENTION  °The best way to protect yourself from getting a cold is to practice good hygiene.  °· Avoid oral or hand contact with people with cold   symptoms.   °· Wash your hands often if contact occurs.   °There is no clear evidence that vitamin C, vitamin E, echinacea, or exercise reduces the chance of developing a cold. However, it is always recommended to get plenty of rest, exercise, and practice good nutrition.  °SEEK MEDICAL CARE IF:  °· You are getting worse rather than better.   °· Your symptoms are not controlled by medicine.   °· You have chills. °· You have worsening shortness of breath. °· You have brown or red mucus. °· You have yellow or brown nasal  discharge. °· You have pain in your face, especially when you bend forward. °· You have a fever. °· You have swollen neck glands. °· You have pain while swallowing. °· You have white areas in the back of your throat. °SEEK IMMEDIATE MEDICAL CARE IF:  °· You have severe or persistent: °¨ Headache. °¨ Ear pain. °¨ Sinus pain. °¨ Chest pain. °· You have chronic lung disease and any of the following: °¨ Wheezing. °¨ Prolonged cough. °¨ Coughing up blood. °¨ A change in your usual mucus. °· You have a stiff neck. °· You have changes in your: °¨ Vision. °¨ Hearing. °¨ Thinking. °¨ Mood. °MAKE SURE YOU:  °· Understand these instructions. °· Will watch your condition. °· Will get help right away if you are not doing well or get worse. °  °This information is not intended to replace advice given to you by your health care provider. Make sure you discuss any questions you have with your health care provider. °  °Document Released: 11/20/2000 Document Revised: 10/11/2014 Document Reviewed: 09/01/2013 °Elsevier Interactive Patient Education ©2016 [employer] ° °Cough, Adult °Coughing is a reflex that clears your throat and your airways. Coughing helps to heal and protect your lungs. It is normal to cough occasionally, but a cough that happens with other symptoms or lasts a long time may be a sign of a condition that needs treatment. A cough may last only 2-3 weeks (acute), or it may last longer than 8 weeks (chronic). °CAUSES °Coughing is commonly caused by: °· Breathing in substances that irritate your lungs. °· A viral or bacterial respiratory infection. °· Allergies. °· Asthma. °· Postnasal drip. °· Smoking. °· Acid backing up from the stomach into the esophagus (gastroesophageal reflux). °· Certain medicines. °· Chronic lung problems, including COPD (or rarely, lung cancer). °· Other medical conditions such as heart failure. °HOME CARE INSTRUCTIONS  °Pay attention to any changes in your symptoms. Take these actions to  help with your discomfort: °· Take medicines only as told by your health care provider. °¨ If you were prescribed an antibiotic medicine, take it as told by your health care provider. Do not stop taking the antibiotic even if you start to feel better. °¨ Talk with your health care provider before you take a cough suppressant medicine. °· Drink enough fluid to keep your urine clear or pale yellow. °· If the air is dry, use a cold steam vaporizer or humidifier in your bedroom or your home to help loosen secretions. °· Avoid anything that causes you to cough at work or at home. °· If your cough is worse at night, try sleeping in a semi-upright position. °· Avoid cigarette smoke. If you smoke, quit smoking. If you need help quitting, ask your health care provider. °· Avoid caffeine. °· Avoid alcohol. °· Rest as needed. °SEEK MEDICAL CARE IF:  °· You have new symptoms. °· You cough up pus. °· Your cough   does not get better after 2-3 weeks, or your cough gets worse. °· You cannot control your cough with suppressant medicines and you are losing sleep. °· You develop pain that is getting worse or pain that is not controlled with pain medicines. °· You have a fever. °· You have unexplained weight loss. °· You have night sweats. °SEEK IMMEDIATE MEDICAL CARE IF: °· You cough up blood. °· You have difficulty breathing. °· Your heartbeat is very fast. °  °This information is not intended to replace advice given to you by your health care provider. Make sure you discuss any questions you have with your health care provider. °  °Document Released: 11/23/2010 Document Revised: 02/15/2015 Document Reviewed: 08/03/2014 °Elsevier Interactive Patient Education ©2016 [employer] ° °

## 2015-05-04 NOTE — ED Provider Notes (Signed)
CSN: [id number]     Arrival date & time 05/04/15  0308 History  By signing my name below, I, [name], attest that this documentation has been prepared under the direction and in the presence of Derwood Kaplan, MD . Electronically Signed: [name], Scribe. 05/04/2015. 5:45 AM.  Chief Complaint  Patient presents with  . Cough  . Nasal Congestion    The history is provided by the patient. No language interpreter was used.   HPI Comments:  Timothy Charles is a 44 y.o. male who presents to the Emergency Department complaining of cough, occasionally productive with dark phlegm for ~ 2 days. Pt notes the cough worsened today. He reports associated congestion, sore throat, rhinorrhea, mild HA and pain across his chest secondary to cough. Pt received flu shot ~ 1 week ago. He denies fever and diaphoresis. He also denies sick contacts. No alleviating factors noted.  Past Medical History  Diagnosis Date  . Allergy   . Anxiety   . Hypertension    History reviewed. No pertinent past surgical history. Family History  Problem Relation Age of Onset  . Cancer Mother   . Diabetes Mother   . Heart disease Mother   . Hyperlipidemia Mother   . Hypertension Mother   . Stroke Mother   . Diabetes Brother   . Heart disease Brother   . Hyperlipidemia Brother   . Stroke Brother   . Diabetes Brother   . Hyperlipidemia Brother   . Hypertension Brother    Social History  Substance Use Topics  . Smoking status: Former Games developer  . Smokeless tobacco: None  . Alcohol Use: No    Review of Systems  10 systems reviewed and all are negative for acute change except as noted in the HPI.   Allergies  Review of patient's allergies indicates no known allergies.  Home Medications   Prior to Admission medications   Medication Sig Start Date End Date Taking? Authorizing Provider  amoxicillin (AMOXIL) 875 MG tablet Take 1 tablet (875 mg total) by mouth 2 (two) times daily. Patient not taking:  Reported on 11/26/2014 03/05/14   Peyton Najjar, MD  azithromycin (ZITHROMAX Z-PAK) 250 MG tablet 2 po day one, then 1 daily x 4 days 11/26/14   Bethann Berkshire, MD  benzonatate (TESSALON) 100 MG capsule Take 1 capsule (100 mg total) by mouth every 8 (eight) hours. 05/04/15   Derwood Kaplan, MD  chlorpheniramine-HYDROcodone (TUSSIONEX PENNKINETIC ER) 10-8 MG/5ML SUER Take 5 mLs by mouth every 12 (twelve) hours as needed for cough. 11/26/14   Bethann Berkshire, MD  fluticasone (FLONASE) 50 MCG/ACT nasal spray Place 1 spray into both nostrils daily.    Historical Provider, MD  HYDROcodone-acetaminophen (NORCO) 5-325 MG per tablet Take 1 tablet by mouth every 6 (six) hours as needed. Patient not taking: Reported on 11/26/2014 03/05/14   Peyton Najjar, MD  predniSONE (DELTASONE) 10 MG tablet Take 6 tablets (60 mg total) by mouth daily. 05/04/15   Derwood Kaplan, MD  pseudoephedrine (SUDAFED 12 HOUR) 120 MG 12 hr tablet Take 1 tablet (120 mg total) by mouth 2 (two) times daily. 05/04/15   Jasiyah Poland, MD   BP 163/104 mmHg  Pulse 107  Temp(Src) 98.8 F (37.1 C) (Oral)  Resp 18  SpO2 97% Physical Exam  Constitutional: He is oriented to person, place, and time. He appears well-developed and well-nourished. No distress.  HENT:  Head: Normocephalic and atraumatic.  Eyes: Conjunctivae are normal.  Pulmonary/Chest: Effort normal and breath  sounds normal. No respiratory distress.  Abdominal: He exhibits no distension.  Neurological: He is alert and oriented to person, place, and time.  Skin: Skin is warm and dry.  Psychiatric: He has a normal mood and affect.  Nursing note and vitals reviewed.   ED Course  Procedures   DIAGNOSTIC STUDIES:  Oxygen Saturation is 95% on RA, adequate by my interpretation.    COORDINATION OF CARE:  3:54 AM Discussed treatment plan with pt at bedside and pt agreed to plan.   MDM   Final diagnoses:  Viral URI with cough   I personally performed the services  described in this documentation, which was scribed in my presence. The recorded information has been reviewed and is accurate.  With all the constitutionals that are positive - and CXR findings, pt has a viral URI. Will treat symptomatically.   Derwood KaplanAnkit Jeffree Cazeau, MD 05/04/15 [id number]

## 2015-07-19 ENCOUNTER — Other Ambulatory Visit: Payer: Self-pay | Admitting: Pain Medicine

## 2015-07-19 DIAGNOSIS — M545 Low back pain: Secondary | ICD-10-CM

## 2015-07-28 ENCOUNTER — Other Ambulatory Visit: Payer: BLUE CROSS/BLUE SHIELD

## 2015-09-30 ENCOUNTER — Encounter (HOSPITAL_COMMUNITY): Payer: Self-pay | Admitting: Emergency Medicine

## 2015-09-30 ENCOUNTER — Inpatient Hospital Stay (HOSPITAL_COMMUNITY)
Admission: EM | Admit: 2015-09-30 | Discharge: 2015-10-07 | DRG: 247 | Disposition: A | Discharge status: Home / Self Care | Payer: BLUE CROSS/BLUE SHIELD | Attending: Interventional Cardiology | Admitting: Interventional Cardiology

## 2015-09-30 ENCOUNTER — Emergency Department (HOSPITAL_COMMUNITY): Payer: BLUE CROSS/BLUE SHIELD

## 2015-09-30 DIAGNOSIS — Z79899 Other long term (current) drug therapy: Secondary | ICD-10-CM | POA: Diagnosis not present

## 2015-09-30 DIAGNOSIS — Z8249 Family history of ischemic heart disease and other diseases of the circulatory system: Secondary | ICD-10-CM

## 2015-09-30 DIAGNOSIS — I25110 Atherosclerotic heart disease of native coronary artery with unstable angina pectoris: Secondary | ICD-10-CM | POA: Diagnosis present

## 2015-09-30 DIAGNOSIS — J309 Allergic rhinitis, unspecified: Secondary | ICD-10-CM | POA: Diagnosis present

## 2015-09-30 DIAGNOSIS — Z7952 Long term (current) use of systemic steroids: Secondary | ICD-10-CM | POA: Diagnosis not present

## 2015-09-30 DIAGNOSIS — I959 Hypotension, unspecified: Secondary | ICD-10-CM | POA: Diagnosis present

## 2015-09-30 DIAGNOSIS — F419 Anxiety disorder, unspecified: Secondary | ICD-10-CM | POA: Diagnosis present

## 2015-09-30 DIAGNOSIS — F17210 Nicotine dependence, cigarettes, uncomplicated: Secondary | ICD-10-CM | POA: Diagnosis present

## 2015-09-30 DIAGNOSIS — I214 Non-ST elevation (NSTEMI) myocardial infarction: Secondary | ICD-10-CM | POA: Diagnosis present

## 2015-09-30 DIAGNOSIS — I237 Postinfarction angina: Secondary | ICD-10-CM | POA: Diagnosis not present

## 2015-09-30 DIAGNOSIS — I213 ST elevation (STEMI) myocardial infarction of unspecified site: Secondary | ICD-10-CM | POA: Diagnosis not present

## 2015-09-30 DIAGNOSIS — I2510 Atherosclerotic heart disease of native coronary artery without angina pectoris: Secondary | ICD-10-CM | POA: Diagnosis not present

## 2015-09-30 DIAGNOSIS — I10 Essential (primary) hypertension: Secondary | ICD-10-CM | POA: Diagnosis present

## 2015-09-30 DIAGNOSIS — E785 Hyperlipidemia, unspecified: Secondary | ICD-10-CM | POA: Diagnosis present

## 2015-09-30 DIAGNOSIS — R079 Chest pain, unspecified: Secondary | ICD-10-CM | POA: Diagnosis not present

## 2015-09-30 DIAGNOSIS — Z955 Presence of coronary angioplasty implant and graft: Secondary | ICD-10-CM

## 2015-09-30 HISTORY — DX: Spinal stenosis, lumbar region without neurogenic claudication: M48.061

## 2015-09-30 HISTORY — DX: Essential (primary) hypertension: I10

## 2015-09-30 LAB — CBC
HCT: 42.2 % (ref 39.0–52.0)
HEMOGLOBIN: 14.5 g/dL (ref 13.0–17.0)
MCH: 30.3 pg (ref 26.0–34.0)
MCHC: 34.4 g/dL (ref 30.0–36.0)
MCV: 88.3 fL (ref 78.0–100.0)
Platelets: 210 10*3/uL (ref 150–400)
RBC: 4.78 MIL/uL (ref 4.22–5.81)
RDW: 13.1 % (ref 11.5–15.5)
WBC: 8.4 10*3/uL (ref 4.0–10.5)

## 2015-09-30 LAB — BASIC METABOLIC PANEL
ANION GAP: 11 (ref 5–15)
BUN: 16 mg/dL (ref 6–20)
CALCIUM: 9.5 mg/dL (ref 8.9–10.3)
CO2: 24 mmol/L (ref 22–32)
CREATININE: 0.92 mg/dL (ref 0.61–1.24)
Chloride: 104 mmol/L (ref 101–111)
Glucose, Bld: 114 mg/dL — ABNORMAL HIGH (ref 65–99)
Potassium: 3.6 mmol/L (ref 3.5–5.1)
Sodium: 139 mmol/L (ref 135–145)

## 2015-09-30 LAB — I-STAT TROPONIN, ED
TROPONIN I, POC: 0.04 ng/mL (ref 0.00–0.08)
TROPONIN I, POC: 0.51 ng/mL — AB (ref 0.00–0.08)

## 2015-09-30 LAB — TROPONIN I: TROPONIN I: 0.95 ng/mL — AB (ref <0.031)

## 2015-09-30 MED ORDER — SODIUM CHLORIDE 0.9 % IV SOLN
INTRAVENOUS | Status: DC
  Administered 2015-10-01: 100 mL/h via INTRAVENOUS

## 2015-09-30 MED ORDER — ATORVASTATIN CALCIUM 80 MG PO TABS
80.0000 mg | ORAL_TABLET | Freq: Every day | ORAL | Status: DC
  Administered 2015-10-01 – 2015-10-06 (×6): 80 mg via ORAL
  Filled 2015-09-30 (×7): qty 1

## 2015-09-30 MED ORDER — SODIUM CHLORIDE 0.9% FLUSH
3.0000 mL | Freq: Two times a day (BID) | INTRAVENOUS | Status: DC
  Administered 2015-09-30: 3 mL via INTRAVENOUS

## 2015-09-30 MED ORDER — NITROGLYCERIN IN D5W 200-5 MCG/ML-% IV SOLN
5.0000 ug/min | Freq: Once | INTRAVENOUS | Status: AC
  Administered 2015-09-30: 5 ug/min via INTRAVENOUS
  Filled 2015-09-30: qty 250

## 2015-09-30 MED ORDER — ASPIRIN EC 81 MG PO TBEC: 81.0000 mg | DELAYED_RELEASE_TABLET | Freq: Every day | ORAL | Status: DC

## 2015-09-30 MED ORDER — METOPROLOL TARTRATE 1 MG/ML IV SOLN
INTRAVENOUS | Status: AC
  Filled 2015-09-30: qty 5

## 2015-09-30 MED ORDER — PNEUMOCOCCAL VAC POLYVALENT 25 MCG/0.5ML IJ INJ
0.5000 mL | INJECTION | INTRAMUSCULAR | Status: DC
  Filled 2015-09-30: qty 0.5

## 2015-09-30 MED ORDER — HEPARIN (PORCINE) IN NACL 100-0.45 UNIT/ML-% IJ SOLN
1400.0000 [IU]/h | INTRAMUSCULAR | Status: DC
  Administered 2015-09-30: 1200 [IU]/h via INTRAVENOUS
  Filled 2015-09-30: qty 250

## 2015-09-30 MED ORDER — GI COCKTAIL ~~LOC~~
30.0000 mL | Freq: Once | ORAL | Status: AC
  Administered 2015-09-30: 30 mL via ORAL
  Filled 2015-09-30: qty 30

## 2015-09-30 MED ORDER — ACETAMINOPHEN 325 MG PO TABS: 650.0000 mg | ORAL_TABLET | Freq: Four times a day (QID) | ORAL | Status: DC | PRN

## 2015-09-30 MED ORDER — ASPIRIN 81 MG PO CHEW
324.0000 mg | CHEWABLE_TABLET | Freq: Once | ORAL | Status: AC
  Administered 2015-09-30: 324 mg via ORAL

## 2015-09-30 MED ORDER — MORPHINE SULFATE (PF) 4 MG/ML IV SOLN
4.0000 mg | INTRAVENOUS | Status: DC | PRN
  Administered 2015-09-30: 4 mg via INTRAVENOUS
  Filled 2015-09-30 (×2): qty 1

## 2015-09-30 MED ORDER — ONDANSETRON HCL 4 MG/2ML IJ SOLN
4.0000 mg | Freq: Once | INTRAMUSCULAR | Status: AC
  Administered 2015-09-30: 4 mg via INTRAVENOUS
  Filled 2015-09-30: qty 2

## 2015-09-30 MED ORDER — ONDANSETRON HCL 4 MG PO TABS: 4.0000 mg | ORAL_TABLET | Freq: Four times a day (QID) | ORAL | Status: DC | PRN

## 2015-09-30 MED ORDER — MORPHINE SULFATE (PF) 2 MG/ML IV SOLN
INTRAVENOUS | Status: AC
  Filled 2015-09-30: qty 1

## 2015-09-30 MED ORDER — ASPIRIN 81 MG PO CHEW
CHEWABLE_TABLET | ORAL | Status: AC
  Filled 2015-09-30: qty 4

## 2015-09-30 MED ORDER — HEPARIN SODIUM (PORCINE) 5000 UNIT/ML IJ SOLN
4000.0000 [IU] | Freq: Once | INTRAMUSCULAR | Status: AC
  Administered 2015-09-30: 4000 [IU] via INTRAVENOUS
  Filled 2015-09-30: qty 0.8

## 2015-09-30 MED ORDER — DOCUSATE SODIUM 100 MG PO CAPS
100.0000 mg | ORAL_CAPSULE | Freq: Two times a day (BID) | ORAL | Status: DC
  Administered 2015-10-01 – 2015-10-07 (×11): 100 mg via ORAL
  Filled 2015-09-30 (×14): qty 1

## 2015-09-30 MED ORDER — HEPARIN (PORCINE) IN NACL 100-0.45 UNIT/ML-% IJ SOLN: 15.0000 [IU]/kg/h | Freq: Once | INTRAMUSCULAR | Status: DC

## 2015-09-30 MED ORDER — NITROGLYCERIN 0.4 MG SL SUBL
SUBLINGUAL_TABLET | SUBLINGUAL | Status: AC
  Filled 2015-09-30: qty 1

## 2015-09-30 MED ORDER — MORPHINE SULFATE (PF) 4 MG/ML IV SOLN
4.0000 mg | INTRAVENOUS | Status: DC | PRN
  Administered 2015-09-30: 4 mg via INTRAVENOUS
  Filled 2015-09-30: qty 1

## 2015-09-30 MED ORDER — ONDANSETRON HCL 4 MG/2ML IJ SOLN
4.0000 mg | Freq: Four times a day (QID) | INTRAMUSCULAR | Status: DC | PRN
  Administered 2015-09-30: 4 mg via INTRAVENOUS
  Filled 2015-09-30: qty 2

## 2015-09-30 MED ORDER — ACETAMINOPHEN 650 MG RE SUPP: 650.0000 mg | Freq: Four times a day (QID) | RECTAL | Status: DC | PRN

## 2015-09-30 NOTE — H&P (Signed)
C.c transfer from [name] for nstemi HPI: 45 y/o man with pmh of htn and spinal stenosis on pain meds presented in [name] with c/o intermittent chest pain that started earlier toady and became worse. His initial workup showed mildly elevated tropoinin without any STE on EKG. Cardiology was consulted and patient transferred to [facility]. No prior h/o cad. Non smoker. Family h/o cad in brother at young age. His initial trop was 0.5 and increased to 0.95. He was started on heparin drip and ntg dirp with mild constant pain.  Pain worse with moving and deep breathing   Review of Systems:     Cardiac Review of Systems: {Y] = yes [ ]  = no  Chest Pain [   y ]  Resting SOB [   ] Exertional SOB  [  ]  Orthopnea [  ]   Pedal Edema [   ]    Palpitations [  ] Syncope  [  ]   Presyncope [   ]  General Review of Systems: [Y] = yes [  ]=no Constitional: recent weight change [  ]; anorexia [  ]; fatigue [  ]; nausea [  ]; night sweats [  ]; fever [  ]; or chills [  ];                                                                     Dental: poor dentition[  ];   Eye : blurred vision [  ]; diplopia [   ]; vision changes [  ];  Amaurosis fugax[  ]; Resp: cough [  ];  wheezing[  ];  hemoptysis[  ]; shortness of breath[  ]; paroxysmal nocturnal dyspnea[  ]; dyspnea on exertion[  ]; or orthopnea[  ];  GI:  gallstones[  ], vomiting[  ];  dysphagia[  ]; melena[  ];  hematochezia [  ]; heartburn[  ];   GU: kidney stones [  ]; hematuria[  ];   dysuria [  ];  nocturia[  ];               Skin: rash [  ], swelling[  ];, hair loss[  ];  peripheral edema[  ];  or itching[  ]; Musculosketetal: myalgias[  ];  joint swelling[  ];  joint erythema[  ];  joint pain[  ];  back pain[  ];  Heme/Lymph: bruising[  ];  bleeding[  ];  anemia[  ];  Neuro: TIA[  ];  headaches[  ];  stroke[  ];  vertigo[  ];  seizures[  ];   paresthesias[  ];  difficulty walking[  ];  Psych:depression[  ]; anxiety[  ];  Endocrine: diabetes[  ];   thyroid dysfunction[  ];  Other:  Past Medical History  Diagnosis Date  . Allergy   . Anxiety   . Hypertension   . Spinal stenosis at L4-L5 level     No current facility-administered medications on file prior to encounter.   Current Outpatient Prescriptions on File Prior to Encounter  Medication Sig Dispense Refill  . HYDROcodone-acetaminophen (NORCO) 5-325 MG per tablet Take 1 tablet by mouth every 6 (six) hours as needed. 30 tablet 0  . azithromycin (ZITHROMAX Z-PAK) 250  MG tablet 2 po day one, then 1 daily x 4 days 5 tablet 0  . benzonatate (TESSALON) 100 MG capsule Take 1 capsule (100 mg total) by mouth every 8 (eight) hours. 21 capsule 0  . predniSONE (DELTASONE) 10 MG tablet Take 6 tablets (60 mg total) by mouth daily. 30 tablet 0  . pseudoephedrine (SUDAFED 12 HOUR) 120 MG 12 hr tablet Take 1 tablet (120 mg total) by mouth 2 (two) times daily. 10 tablet 0      No Known Allergies  Social History   Social History  . Marital Status: Married    Spouse Name: N/A  . Number of Children: N/A  . Years of Education: N/A   Occupational History  . Not on file.   Social History Main Topics  . Smoking status: Light Tobacco Smoker  . Smokeless tobacco: Not on file  . Alcohol Use: Yes     Comment: occasional  . Drug Use: No  . Sexual Activity: Not on file   Other Topics Concern  . Not on file   Social History Narrative    Family History  Problem Relation Age of Onset  . Cancer Mother   . Diabetes Mother   . Heart disease Mother   . Hyperlipidemia Mother   . Hypertension Mother   . Stroke Mother   . Diabetes Brother   . Heart disease Brother   . Hyperlipidemia Brother   . Stroke Brother   . Diabetes Brother   . Hyperlipidemia Brother   . Hypertension Brother     PHYSICAL EXAM: Filed Vitals:   09/30/15 2155 09/30/15 2237  BP: 91/61 107/69  Pulse: 80 62  Temp:    Resp: 19 21   General:  Well appearing. No respiratory difficulty HEENT: normal Neck:  supple. no JVD. Carotids 2+ bilat; no bruits. No lymphadenopathy or thryomegaly appreciated. Cor: PMI nondisplaced. Regular rate & rhythm. No rubs, gallops or murmurs. Lungs: clear Abdomen: soft, nontender, nondistended. No hepatosplenomegaly. No bruits or masses. Good bowel sounds. Extremities: no cyanosis, clubbing, rash, edema Neuro: alert & oriented x 3, cranial nerves grossly intact. moves all 4 extremities w/o difficulty. Affect pleasant.  ECG:  Results for orders placed or performed during the hospital encounter of 09/30/15 (from the past 24 hour(s))  Basic metabolic panel     Status: Abnormal   Collection Time: 09/30/15  5:24 PM  Result Value Ref Range   Sodium 139 135 - 145 mmol/L   Potassium 3.6 3.5 - 5.1 mmol/L   Chloride 104 101 - 111 mmol/L   CO2 24 22 - 32 mmol/L   Glucose, Bld 114 (H) 65 - 99 mg/dL   BUN 16 6 - 20 mg/dL   Creatinine, Ser 1.61 0.61 - 1.24 mg/dL   Calcium 9.5 8.9 - 09.6 mg/dL   GFR calc non Af Amer >60 >60 mL/min   GFR calc Af Amer >60 >60 mL/min   Anion gap 11 5 - 15  CBC     Status: None   Collection Time: 09/30/15  5:24 PM  Result Value Ref Range   WBC 8.4 4.0 - 10.5 K/uL   RBC 4.78 4.22 - 5.81 MIL/uL   Hemoglobin 14.5 13.0 - 17.0 g/dL   HCT 04.5 40.9 - 81.1 %   MCV 88.3 78.0 - 100.0 fL   MCH 30.3 26.0 - 34.0 pg   MCHC 34.4 30.0 - 36.0 g/dL   RDW 91.4 78.2 - 95.6 %   Platelets 210 150 - 400  K/uL  I-stat troponin, ED     Status: None   Collection Time: 09/30/15  5:30 PM  Result Value Ref Range   Troponin i, poc 0.04 0.00 - 0.08 ng/mL   Comment 3          I-Stat Troponin, ED (not at [facility])     Status: Abnormal   Collection Time: 09/30/15  8:40 PM  Result Value Ref Range   Troponin i, poc 0.51 (HH) 0.00 - 0.08 ng/mL   Comment NOTIFIED PHYSICIAN    Comment 3           Dg Chest 2 View  09/30/2015  CLINICAL DATA:  LEFT-sided chest pain. EXAM: CHEST  2 VIEW COMPARISON:  05/04/2015 FINDINGS: Normal mediastinum and cardiac silhouette. Normal  pulmonary vasculature. No evidence of effusion, infiltrate, or pneumothorax. No acute bony abnormality. IMPRESSION: No acute cardiopulmonary process. Electronically Signed   By: Genevive Bi M.D.   On: 09/30/2015 17:49    ecg sr non specific ST  ASSESSMENT: 1. Elevated trop with cp -- nstemi will continue heparin drip and ntg drip and cath in am  2. htn  PLAN/DISCUSSION: 1. Admit to sdu, LHC in am unless completely chest pain free 2. Statins, asa, heparin drip 3. Echo in am  4. Cycle trop   Timothy Charles

## 2015-09-30 NOTE — Progress Notes (Signed)
ANTICOAGULATION CONSULT NOTE - Initial Consult  Pharmacy Consult for Heparin Indication: chest pain/ACS  No Known Allergies  Patient Measurements: Height: 5\' 11"  (180.3 cm) Weight: 215 lb (97.523 kg) IBW/kg (Calculated) : 75.3 Heparin Dosing Weight: 95  Vital Signs: Temp: 98.2 F (36.8 C) (04/22 1930) Temp Source: Oral (04/22 1930) BP: 135/100 mmHg (04/22 2145) Pulse Rate: 62 (04/22 2145)  Labs:  Recent Labs  09/30/15 1724  HGB 14.5  HCT 42.2  PLT 210  CREATININE 0.92    Estimated Creatinine Clearance: 123.3 mL/min (by C-G formula based on Cr of 0.92).   Medical History: Past Medical History  Diagnosis Date  . Allergy   . Anxiety   . Hypertension   . Spinal stenosis at L4-L5 level     Medications:  Scheduled:   Infusions:  . heparin 1,200 Units/hr (09/30/15 2132)    Assessment:  [age] yr male presents with chest pain, SOB, left arm tingling  Troponin = 0.51 with c/o of nausea in the ED  Code STEMI called  Pharmacy consulted to dose Heparin  Patient on no oral anticoagulation PTA  Goal of Therapy:  Heparin level 0.3-0.7 units/ml Monitor platelets by anticoagulation protocol: Yes   Plan:   Heparin 4000 unit IV bolus x 1 followed by heparin infusion @ 1200 units/hr  [facility]  Check heparin level 6 hr after heparin started  Mazy Culton, Joselyn GlassmanLeann Trefz, PharmD 09/30/2015,9:57 PM

## 2015-09-30 NOTE — ED Notes (Signed)
Pt states today around 1600 he began having centralized chest pain.  Pt states he has had shortness of breath without nausea and vomiting or dizziness.  Wife at bedside. Pt states left arm has a tingling sensation.  Pt also states he is experiencing heart burn today.

## 2015-09-30 NOTE — ED Notes (Signed)
CARELINK at bedside. NS infusing after drop in bp from increase in NITROGLYCERINE.

## 2015-09-30 NOTE — ED Provider Notes (Signed)
Since seen and evaluated with Mattie MarlinJessica Focht PA-C.  Initial EKG shows less than 1 mm of elevation in aVF. Nonspecific changes in 1, aVL. T wave inversions V1 and V2. Pressure protein is normal. Given sublingual nitroglycerin. I was made aware of the patient after a positive troponin. Patient seen immediately. Repeat EKG obtained. Shows Q-wave in lead 3. No ST elevations noted. Otherwise unchanged. Given heparin bolus and drip, after glycerin drip. Given morphine Zofran. Aspirin. I discussed the case with Dr.rathore, Cardiologist [facility] call [facility].    [facility] to [facility]. Symptoms improving. Has normal pulses and symmetric pressures. No widening of mediastinum. No murmurs. Doubt dissection. No risk for PE. No pleuritic discomfort. Not tachycardic or hypoxemic. Finding consistent with N-STEMI.  Rolland PorterMark Dene Nazir, MD 09/30/15 2356

## 2015-09-30 NOTE — ED Notes (Signed)
PT began having nausea after being given GI Cocktail. Also lab advised of abnormal troponin of 0.51. Jessica, PA notified of changes and stat EKG done. Shanda BumpsJessica, [place]PA and MD at bedside to discuss plan of care.

## 2015-09-30 NOTE — ED Provider Notes (Signed)
CSN: [id number]     Arrival date & time 09/30/15  1637 History   First MD Initiated Contact with Patient 09/30/15 1952     Chief Complaint  Patient presents with  . Chest Pain     (Consider location/radiation/quality/duration/timing/severity/associated sxs/prior Treatment) The history is provided by the patient and the spouse.     Patient is a 45 y/o male with a history of chronic lower back pain and HTN presents with chest pain and tightness since 1400 today. 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 Associated posterior left arm numbness/tingling, SOB on exertion, mild non-productive cough, vomiting, nausea, and increased belching. Denies fever, chills, diarrhea, constipation, blood in vomit or stools, weakness, thoracic back pain, or syncope. Family history of MI's. Pt also complains of neck pain x 1 month. Recent travel to include a drive back from [place] 1 week ago.  Past Medical History  Diagnosis Date  . Allergy   . Anxiety   . Hypertension   . Spinal stenosis at L4-L5 level    History reviewed. No pertinent past surgical history. Family History  Problem Relation Age of Onset  . Cancer Mother   . Diabetes Mother   . Heart disease Mother   . Hyperlipidemia Mother   . Hypertension Mother   . Stroke Mother   . Diabetes Brother   . Heart disease Brother   . Hyperlipidemia Brother   . Stroke Brother   . Diabetes Brother   . Hyperlipidemia Brother   . Hypertension Brother    Social History  Substance Use Topics  . Smoking status: Light Tobacco Smoker  . Smokeless tobacco: None  . Alcohol Use: Yes     Comment: occasional    Review of Systems  Constitutional: Positive for diaphoresis. Negative for fever and chills.  HENT: Negative for ear pain, hearing loss, sore throat and trouble swallowing.   Eyes: Negative for visual disturbance.    Respiratory: Positive for cough, chest tightness and shortness of breath. Negative for wheezing.   Cardiovascular: Positive for chest pain. Negative for leg swelling.  Gastrointestinal: Positive for nausea, vomiting and abdominal pain. Negative for diarrhea, constipation, blood in stool and abdominal distention.  Genitourinary: Negative for dysuria and flank pain.  Musculoskeletal: Positive for neck pain. Negative for myalgias and back pain.  Skin: Negative for color change and rash.  Neurological: Positive for numbness. Negative for dizziness, syncope, speech difficulty, weakness, light-headedness and headaches.  Psychiatric/Behavioral: Negative for confusion and agitation.      Allergies  Review of patient's allergies indicates no known allergies.  Home Medications   Prior to Admission medications   Medication Sig Start Date End Date Taking? Authorizing Provider  HYDROcodone-acetaminophen (NORCO) 5-325 MG per tablet Take 1 tablet by mouth every 6 (six) hours as needed. 03/05/14  Yes Peyton Najjaravid H Hopper, MD  levocetirizine (XYZAL) 5 MG tablet Take 5 mg by mouth every evening.   Yes Historical Provider, MD  tiZANidine (ZANAFLEX) 2 MG tablet Take 2 mg by mouth at bedtime. 06/29/15  Yes Historical Provider, MD  azithromycin (ZITHROMAX Z-PAK) 250 MG tablet 2 po day one, then 1 daily x 4 days 11/26/14   Bethann BerkshireJoseph Zammit, MD  benzonatate (TESSALON) 100 MG capsule Take 1 capsule (100 mg total) by mouth every 8 (eight) hours. 05/04/15   Derwood KaplanAnkit Nanavati, MD  predniSONE (DELTASONE) 10 MG tablet Take 6  tablets (60 mg total) by mouth daily. 05/04/15   Derwood Kaplan, MD  pseudoephedrine (SUDAFED 12 HOUR) 120 MG 12 hr tablet Take 1 tablet (120 mg total) by mouth 2 (two) times daily. 05/04/15   Ankit Nanavati, MD   BP 140/96 mmHg  Pulse 76  Temp(Src) 98.2 F (36.8 C) (Oral)  Resp 23  Ht  (1.803 m)  Wt 97.523 kg  BMI 30.00 kg/m2  SpO2 100% Physical Exam  Constitutional: He is oriented to person,  place, and time. He appears well-developed and well-nourished. No distress.  HENT:  Head: Normocephalic and atraumatic.  Eyes: Conjunctivae are normal.  Neck: Normal range of motion. No tracheal deviation present.  TTP of cervical spine  Cardiovascular: Normal rate, regular rhythm, S1 normal, S2 normal, normal heart sounds and normal pulses.  Exam reveals no friction rub.   No murmur heard. Pulses:      Radial pulses are 2+ on the right side, and 2+ on the left side.       Dorsalis pedis pulses are 2+ on the right side, and 2+ on the left side.       Posterior tibial pulses are 2+ on the right side, and 2+ on the left side.  Pulmonary/Chest: Effort normal and breath sounds normal. No accessory muscle usage. No respiratory distress. He exhibits tenderness. He exhibits no mass and no crepitus.    Abdominal: Soft. Bowel sounds are decreased. There is tenderness in the epigastric area.  Musculoskeletal: Normal range of motion. He exhibits no edema.  Neurological: He is alert and oriented to person, place, and time. He has normal strength. Coordination normal.  Sensation intact of bilateral lower extremities, strength 5/5 of plantar flexion and extension.   Skin: Skin is warm and dry. No rash noted. He is not diaphoretic.  Psychiatric: His speech is normal and behavior is normal. His mood appears anxious.    ED Course  Procedures (including critical care time) Labs Review Labs Reviewed  BASIC METABOLIC PANEL - Abnormal; Notable for the following:    Glucose, Bld 114 (*)    All other components within normal limits  I-STAT TROPOININ, ED - Abnormal; Notable for the following:    Troponin i, poc 0.51 (*)    All other components within normal limits  CBC  PROTIME-INR  Rosezena Sensor, ED    Imaging Review Dg Chest 2 View  09/30/2015  CLINICAL DATA:  LEFT-sided chest pain. EXAM: CHEST  2 VIEW COMPARISON:  05/04/2015 FINDINGS: Normal mediastinum and cardiac silhouette. Normal pulmonary  vasculature. No evidence of effusion, infiltrate, or pneumothorax. No acute bony abnormality. IMPRESSION: No acute cardiopulmonary process. Electronically Signed   By: Genevive Bi M.D.   On: 09/30/2015 17:49   I have personally reviewed and evaluated these images and lab results as part of my medical decision-making.   EKG Interpretation   Date/Time:  Saturday September 30 2015 16:45:19 EDT Ventricular Rate:  70 PR Interval:  150 QRS Duration: 97 QT Interval:  377 QTC Calculation: 407 R Axis:   61 Text Interpretation:  Sinus rhythm Inverted t waves anteriorly Non  specific t inversions I, AVL Reconfirmed by Fayrene Fearing  MD, MARK ([id number]) on  09/30/2015 9:01:23 PM     CRITICAL CARE Performed by: Jerre Simon   Total critical care time: 45 minutes  Critical care time was exclusive of separately billable procedures and treating other patients.  Critical care was necessary to treat or prevent imminent or life-threatening deterioration.  Critical care was  time spent personally by me on the following activities: development of treatment plan with patient and/or surrogate as well as nursing, discussions with consultants, evaluation of patient's response to treatment, examination of patient, obtaining history from patient or surrogate, ordering and performing treatments and interventions, ordering and review of laboratory studies, ordering and review of radiographic studies, pulse oximetry and re-evaluation of patient's condition.  MDM   Final diagnoses:  NSTEMI (non-ST elevated myocardial infarction) (HCC)    Pt with chest pain and nonspecific changes on ECG. Initial troponin negative but delta troponin positive at 0.51 suggestive of NSTEMI. One episode of vomiting while in the ED. Pt was given aspririn, a morphine, nitro and heparin drip. Cardiology was consulted by Dr. Fayrene Fearing and patient was transferred to [facility] under the care of Dr. Loney Loh.   Pt remained stable throughout the time he  was in the ED. Chest xray without evidence of pulmonary edema.      Jerre Simon, PA 10/01/15 0041  Rolland Porter, MD 10/14/15 [phone]

## 2015-09-30 NOTE — Progress Notes (Signed)
Pt arrived via [facility] with CP 7/10 on heparin at 1200 units/hr and ntg at 5510mcg/hr.  EKG obtained, Dr. Loney Lohathore notified and rapid response at bedside.

## 2015-10-01 ENCOUNTER — Encounter (HOSPITAL_COMMUNITY): Payer: Self-pay | Admitting: *Deleted

## 2015-10-01 ENCOUNTER — Encounter (HOSPITAL_COMMUNITY)
Admission: EM | Disposition: A | Discharge status: Home / Self Care | Payer: Self-pay | Source: Home / Self Care | Attending: Interventional Cardiology

## 2015-10-01 DIAGNOSIS — E785 Hyperlipidemia, unspecified: Secondary | ICD-10-CM | POA: Diagnosis present

## 2015-10-01 DIAGNOSIS — I2510 Atherosclerotic heart disease of native coronary artery without angina pectoris: Secondary | ICD-10-CM

## 2015-10-01 DIAGNOSIS — I214 Non-ST elevation (NSTEMI) myocardial infarction: Principal | ICD-10-CM

## 2015-10-01 HISTORY — PX: CARDIAC CATHETERIZATION: SHX172

## 2015-10-01 LAB — LIPID PANEL
Cholesterol: 176 mg/dL (ref 0–200)
HDL: 24 mg/dL — AB (ref >40)
LDL CALC: 91 mg/dL (ref 0–99)
Total CHOL/HDL Ratio: 7.3 RATIO
Triglycerides: 304 mg/dL — ABNORMAL HIGH (ref <150)
VLDL: 61 mg/dL — ABNORMAL HIGH (ref 0–40)

## 2015-10-01 LAB — BASIC METABOLIC PANEL
ANION GAP: 11 (ref 5–15)
BUN: 13 mg/dL (ref 6–20)
CALCIUM: 8.4 mg/dL — AB (ref 8.9–10.3)
CO2: 23 mmol/L (ref 22–32)
Chloride: 106 mmol/L (ref 101–111)
Creatinine, Ser: 1.04 mg/dL (ref 0.61–1.24)
GFR calc Af Amer: >60 mL/min (ref >60)
GLUCOSE: 124 mg/dL — AB (ref 65–99)
Potassium: 3.8 mmol/L (ref 3.5–5.1)
Sodium: 140 mmol/L (ref 135–145)

## 2015-10-01 LAB — HEPARIN LEVEL (UNFRACTIONATED): HEPARIN UNFRACTIONATED: 0.22 [IU]/mL — AB (ref 0.30–0.70)

## 2015-10-01 LAB — TROPONIN I
TROPONIN I: 2.5 ng/mL — AB (ref <0.031)
TROPONIN I: 8.33 ng/mL — AB (ref <0.031)
Troponin I: 10.1 ng/mL (ref <0.031)

## 2015-10-01 LAB — MRSA PCR SCREENING: MRSA BY PCR: NEGATIVE

## 2015-10-01 LAB — TSH: TSH: 3.191 u[IU]/mL (ref 0.350–4.500)

## 2015-10-01 SURGERY — LEFT HEART CATH AND CORONARY ANGIOGRAPHY
Anesthesia: Moderate Sedation

## 2015-10-01 MED ORDER — MIDAZOLAM HCL 2 MG/2ML IJ SOLN
INTRAMUSCULAR | Status: DC | PRN
  Administered 2015-10-01: 1 mg via INTRAVENOUS

## 2015-10-01 MED ORDER — LIDOCAINE HCL (PF) 1 % IJ SOLN
INTRAMUSCULAR | Status: AC
  Filled 2015-10-01: qty 30

## 2015-10-01 MED ORDER — NITROGLYCERIN 1 MG/10 ML FOR IR/CATH LAB
INTRA_ARTERIAL | Status: DC | PRN
  Administered 2015-10-01: 200 ug via INTRACORONARY

## 2015-10-01 MED ORDER — VERAPAMIL HCL 2.5 MG/ML IV SOLN
INTRAVENOUS | Status: DC | PRN
  Administered 2015-10-01: 09:00:00 via INTRA_ARTERIAL

## 2015-10-01 MED ORDER — TICAGRELOR 90 MG PO TABS
ORAL_TABLET | ORAL | Status: AC
  Filled 2015-10-01: qty 2

## 2015-10-01 MED ORDER — SODIUM CHLORIDE 0.9% FLUSH: 3.0000 mL | INTRAVENOUS | Status: DC | PRN

## 2015-10-01 MED ORDER — TIZANIDINE HCL 2 MG PO TABS
2.0000 mg | ORAL_TABLET | Freq: Four times a day (QID) | ORAL | Status: DC | PRN
  Administered 2015-10-01 – 2015-10-02 (×2): 2 mg via ORAL
  Filled 2015-10-01 (×5): qty 1

## 2015-10-01 MED ORDER — OXYCODONE-ACETAMINOPHEN 5-325 MG PO TABS
1.0000 | ORAL_TABLET | ORAL | Status: DC | PRN
  Administered 2015-10-02: 22:00:00 1 via ORAL
  Filled 2015-10-01: qty 2

## 2015-10-01 MED ORDER — TICAGRELOR 90 MG PO TABS
90.0000 mg | ORAL_TABLET | Freq: Two times a day (BID) | ORAL | Status: DC
  Administered 2015-10-01 – 2015-10-02 (×4): 90 mg via ORAL
  Filled 2015-10-01 (×4): qty 1

## 2015-10-01 MED ORDER — SODIUM CHLORIDE 0.9% FLUSH: 3.0000 mL | Freq: Two times a day (BID) | INTRAVENOUS | Status: DC

## 2015-10-01 MED ORDER — SODIUM CHLORIDE 0.9 % IV SOLN: 250.0000 mL | INTRAVENOUS | Status: DC | PRN

## 2015-10-01 MED ORDER — BIVALIRUDIN BOLUS VIA INFUSION - CUPID
INTRAVENOUS | Status: DC | PRN
  Administered 2015-10-01: 73.125 mg via INTRAVENOUS

## 2015-10-01 MED ORDER — SODIUM CHLORIDE 0.9 % IV SOLN
INTRAVENOUS | Status: DC | PRN
  Administered 2015-10-01: 250 mL/h via INTRAVENOUS

## 2015-10-01 MED ORDER — NITROGLYCERIN 0.4 MG SL SUBL
0.4000 mg | SUBLINGUAL_TABLET | SUBLINGUAL | Status: DC | PRN
  Administered 2015-10-01 – 2015-10-03 (×2): 0.4 mg via SUBLINGUAL
  Filled 2015-10-01 (×2): qty 1

## 2015-10-01 MED ORDER — BIVALIRUDIN 250 MG IV SOLR
INTRAVENOUS | Status: AC
  Filled 2015-10-01: qty 250

## 2015-10-01 MED ORDER — IOPAMIDOL (ISOVUE-370) INJECTION 76%
INTRAVENOUS | Status: DC | PRN
  Administered 2015-10-01: 155 mL via INTRAVENOUS

## 2015-10-01 MED ORDER — THE SENSUOUS HEART BOOK
Freq: Once | Status: AC
  Administered 2015-10-01: 1
  Filled 2015-10-01: qty 1

## 2015-10-01 MED ORDER — ACETAMINOPHEN 325 MG PO TABS: 650.0000 mg | ORAL_TABLET | ORAL | Status: DC | PRN

## 2015-10-01 MED ORDER — LORATADINE 10 MG PO TABS
10.0000 mg | ORAL_TABLET | Freq: Every day | ORAL | Status: DC
  Administered 2015-10-01 – 2015-10-07 (×6): 10 mg via ORAL
  Filled 2015-10-01 (×6): qty 1

## 2015-10-01 MED ORDER — TICAGRELOR 90 MG PO TABS
ORAL_TABLET | ORAL | Status: DC | PRN
  Administered 2015-10-01: 180 mg via ORAL

## 2015-10-01 MED ORDER — SODIUM CHLORIDE 0.9% FLUSH
3.0000 mL | Freq: Two times a day (BID) | INTRAVENOUS | Status: DC
  Administered 2015-10-01 – 2015-10-02 (×4): 3 mL via INTRAVENOUS

## 2015-10-01 MED ORDER — IOPAMIDOL (ISOVUE-370) INJECTION 76%
INTRAVENOUS | Status: AC
  Filled 2015-10-01: qty 50

## 2015-10-01 MED ORDER — ALUM & MAG HYDROXIDE-SIMETH 200-200-20 MG/5ML PO SUSP
30.0000 mL | ORAL | Status: DC | PRN
  Administered 2015-10-01 – 2015-10-04 (×3): 30 mL via ORAL
  Filled 2015-10-01 (×3): qty 30

## 2015-10-01 MED ORDER — FENTANYL CITRATE (PF) 100 MCG/2ML IJ SOLN
INTRAMUSCULAR | Status: AC
  Filled 2015-10-01: qty 2

## 2015-10-01 MED ORDER — MIDAZOLAM HCL 2 MG/2ML IJ SOLN
INTRAMUSCULAR | Status: AC
  Filled 2015-10-01: qty 2

## 2015-10-01 MED ORDER — HYDROCODONE-ACETAMINOPHEN 5-325 MG PO TABS
1.0000 | ORAL_TABLET | Freq: Four times a day (QID) | ORAL | Status: DC | PRN
  Administered 2015-10-01: 1 via ORAL
  Administered 2015-10-01 – 2015-10-02 (×3): 2 via ORAL
  Filled 2015-10-01: qty 2
  Filled 2015-10-01: qty 1
  Filled 2015-10-01 (×2): qty 2

## 2015-10-01 MED ORDER — FENTANYL CITRATE (PF) 100 MCG/2ML IJ SOLN
INTRAMUSCULAR | Status: DC | PRN
  Administered 2015-10-01: 50 ug via INTRAVENOUS

## 2015-10-01 MED ORDER — ASPIRIN 81 MG PO CHEW
81.0000 mg | CHEWABLE_TABLET | ORAL | Status: AC
  Administered 2015-10-01: 81 mg via ORAL
  Filled 2015-10-01: qty 1

## 2015-10-01 MED ORDER — SODIUM CHLORIDE 0.9 % WEIGHT BASED INFUSION: 3.0000 mL/kg/h | INTRAVENOUS | Status: AC

## 2015-10-01 MED ORDER — SODIUM CHLORIDE 0.9 % IV SOLN: INTRAVENOUS | Status: DC

## 2015-10-01 MED ORDER — ENOXAPARIN SODIUM 40 MG/0.4ML ~~LOC~~ SOLN
40.0000 mg | SUBCUTANEOUS | Status: DC
  Administered 2015-10-02: 40 mg via SUBCUTANEOUS
  Filled 2015-10-01: qty 0.4

## 2015-10-01 MED ORDER — DIPHENHYDRAMINE HCL 25 MG PO CAPS
25.0000 mg | ORAL_CAPSULE | Freq: Every evening | ORAL | Status: AC | PRN
  Administered 2015-10-01: 25 mg via ORAL
  Filled 2015-10-01: qty 1

## 2015-10-01 MED ORDER — HEPARIN (PORCINE) IN NACL 2-0.9 UNIT/ML-% IJ SOLN
INTRAMUSCULAR | Status: AC
  Filled 2015-10-01: qty 1500

## 2015-10-01 MED ORDER — ASPIRIN 81 MG PO CHEW
81.0000 mg | CHEWABLE_TABLET | Freq: Every day | ORAL | Status: DC
  Administered 2015-10-01 – 2015-10-02 (×2): 81 mg via ORAL
  Filled 2015-10-01 (×2): qty 1

## 2015-10-01 MED ORDER — HEPARIN (PORCINE) IN NACL 2-0.9 UNIT/ML-% IJ SOLN
INTRAMUSCULAR | Status: DC | PRN
  Administered 2015-10-01: 1000 mL

## 2015-10-01 MED ORDER — MORPHINE SULFATE (PF) 4 MG/ML IV SOLN
4.0000 mg | Freq: Once | INTRAVENOUS | Status: AC
  Administered 2015-10-01: 4 mg via INTRAVENOUS

## 2015-10-01 MED ORDER — NITROGLYCERIN IN D5W 200-5 MCG/ML-% IV SOLN: 5.0000 ug/min | Freq: Once | INTRAVENOUS | Status: AC

## 2015-10-01 MED ORDER — SODIUM CHLORIDE 0.9 % IV SOLN
250.0000 mg | INTRAVENOUS | Status: DC | PRN
  Administered 2015-10-01: 1.75 mg/kg/h via INTRAVENOUS

## 2015-10-01 MED ORDER — LIDOCAINE HCL (PF) 1 % IJ SOLN
INTRAMUSCULAR | Status: DC | PRN
  Administered 2015-10-01: 3 mL

## 2015-10-01 SURGICAL SUPPLY — 17 items
BALLN ~~LOC~~ TREK RX 4.5X20 (BALLOONS) ×3
BALLOON ~~LOC~~ TREK RX 4.5X20 (BALLOONS) ×1 IMPLANT
CATH EXTRAC PRONTO 5.5F 138CM (CATHETERS) ×3 IMPLANT
CATH INFINITI 5 FR JL3.5 (CATHETERS) ×3 IMPLANT
CATH INFINITI JR4 5F (CATHETERS) ×3 IMPLANT
DEVICE RAD COMP TR BAND LRG (VASCULAR PRODUCTS) ×3 IMPLANT
ELECT DEFIB PAD ADLT CADENCE (PAD) ×3 IMPLANT
GLIDESHEATH SLEND A-KIT 6F 22G (SHEATH) ×3 IMPLANT
GUIDE CATH RUNWAY 6FR FR4 (CATHETERS) ×3 IMPLANT
KIT ENCORE 26 ADVANTAGE (KITS) ×3 IMPLANT
KIT HEART LEFT (KITS) ×3 IMPLANT
PACK CARDIAC CATHETERIZATION (CUSTOM PROCEDURE TRAY) ×3 IMPLANT
STENT PROMUS PREM MR 4.0X28 (Permanent Stent) ×3 IMPLANT
TRANSDUCER W/STOPCOCK (MISCELLANEOUS) ×3 IMPLANT
TUBING CIL FLEX 10 FLL-RA (TUBING) ×3 IMPLANT
WIRE RUNTHROUGH .014X180CM (WIRE) ×3 IMPLANT
WIRE SAFE-T 1.5MM-J .035X260CM (WIRE) ×3 IMPLANT

## 2015-10-01 NOTE — Progress Notes (Signed)
CRITICAL VALUE ALERT  Critical value received:  Trop .95  Date of notification: 10/01/2014 Time of notification:  0015  Critical value read back:Yes.    Nurse who received alert:  Westley ChandlerJill Sreya Froio, RN  MD notified (1st page):  Rathore  Time of first page:  0020  MD notified (2nd page):  Time of second page:  Responding MD:  Loney Lohathore  Time MD responded:  [phone]

## 2015-10-01 NOTE — Progress Notes (Signed)
ANTICOAGULATION CONSULT NOTE - Follow-up Consult  Pharmacy Consult for Heparin Indication: chest pain/ACS  No Known Allergies  Patient Measurements: Height: 5\' 11"  (180.3 cm) Weight: 102 lb 6.4 oz (46.448 kg) IBW/kg (Calculated) : 75.3 Heparin Dosing Weight: 95 kg  Vital Signs: Temp: 97.9 F (36.6 C) (04/22 2237) Temp Source: Oral (04/22 2237) BP: 116/75 mmHg (04/23 0214) Pulse Rate: 74 (04/23 0214)  Labs:  Recent Labs  09/30/15 1724 09/30/15 2305 10/01/15 0419  HGB 14.5  --   --   HCT 42.2  --   --   PLT 210  --   --   HEPARINUNFRC  --   --  0.22*  CREATININE 0.92  --  1.04  TROPONINI  --  0.95* 2.50*    Estimated Creatinine Clearance: 60.1 mL/min (by C-G formula based on Cr of 1.04).   Assessment: 45 y.o. M on heparin for NSTEMI. Trop up to 2.5. Heparin level subtherapeutic on 1200 units/hr. No issues with line or bleeding reported per RN.  Goal of Therapy:  Heparin level 0.3-0.7 units/ml Monitor platelets by anticoagulation protocol: Yes   Plan:  Increase heparin gtt to 1400 units/hr F/u 6 hr heparin level RN to fix wt in computer which was accidentally entered as lb instead of kg when pt transferred.  **Pt does weigh 102 kg**  Timothy Charles, PharmD, BCPS Clinical pharmacist, pager [phone] 10/01/2015,5:06 AM

## 2015-10-01 NOTE — H&P (View-Only) (Signed)
Patient Profile: 45 y/o male with PMH of HTN and spinal stenosis on pain meds who [facility] with c/o intermittent chest pain that started earlier in the day and became worse. His initial workup showed mildly elevated tropoinin without any STE on EKG. Cardiology was consulted and [facility]. No prior h/o CAD. Non smoker. Family h/o CAD in brother at young age. His initial trop was 0.5 and increased to 0.95>>2.50. He was started on heparin drip and nitro drip with mild constant pain.   Subjective: Still with 5/10 chest pain, described as indigesting. He feels uncomfortable. No dyspnea. He has been NPO for over 12 hrs. We discussed possibility of cath today and he is ok with proceeding.   Objective: Vital signs in last 24 hours: Temp:  [97.7 F (36.5 C)-98.2 F (36.8 C)] 97.7 F (36.5 C) (04/23 0500) Pulse Rate:  [58-81] 70 (04/23 0500) Resp:  [12-23] 21 (04/23 0500) BP: (91-159)/(61-100) 98/64 mmHg (04/23 0500) SpO2:  [96 %-100 %] 100 % (04/23 0500) Weight:  [102 lb 9.6 oz (46.539 kg)-215 lb (97.523 kg)] 102 lb 9.6 oz (46.539 kg) (04/23 0218) Last BM Date: 09/30/15  Intake/Output from previous day:   Intake/Output this shift:    Medications Current Facility-Administered Medications  Medication Dose Route Frequency Provider Last Rate Last Dose  . 0.9 %  sodium chloride infusion   Intravenous Continuous Sulaiman Alonna MiniumAziz Rathore, MD 100 mL/hr at 10/01/15 0659 100 mL/hr at 10/01/15 0659  . acetaminophen (TYLENOL) tablet 650 mg  650 mg Oral Q6H PRN Sulaiman Alonna MiniumAziz Rathore, MD       Or  . acetaminophen (TYLENOL) suppository 650 mg  650 mg Rectal Q6H PRN Einar CrowSulaiman Aziz Rathore, MD      . aspirin EC tablet 81 mg  81 mg Oral Daily Sulaiman Alonna MiniumAziz Rathore, MD      . atorvastatin (LIPITOR) tablet 80 mg  80 mg Oral q1800 Einar CrowSulaiman Aziz Rathore, MD      . docusate sodium (COLACE) capsule 100 mg  100 mg Oral BID Einar CrowSulaiman Aziz Rathore, MD   100 mg at 09/30/15  2300  . heparin ADULT infusion 100 units/mL (25000 units/250 mL)  1,400 Units/hr Intravenous Continuous Titus MouldCaron G Amend, RPH 14 mL/hr at 10/01/15 0530 1,400 Units/hr at 10/01/15 0530  . morphine 4 MG/ML injection 4 mg  4 mg Intravenous Q4H PRN Einar CrowSulaiman Aziz Rathore, MD   4 mg at 09/30/15 2244  . nitroGLYCERIN 50 mg in dextrose 5 % 250 mL (0.2 mg/mL) infusion  5-200 mcg/min Intravenous Once Brittainy M Simmons, PA-C      . ondansetron (ZOFRAN) tablet 4 mg  4 mg Oral Q6H PRN Einar CrowSulaiman Aziz Rathore, MD       Or  . ondansetron [facility](ZOFRAN) injection 4 mg  4 mg Intravenous Q6H PRN Sulaiman Alonna MiniumAziz Rathore, MD   4 mg at 09/30/15 2258  . pneumococcal 23 valent vaccine (PNU-IMMUNE) injection 0.5 mL  0.5 mL Intramuscular Tomorrow-1000 Sulaiman Alonna MiniumAziz Rathore, MD      . sodium chloride flush (NS) 0.9 % injection 3 mL  3 mL Intravenous Q12H Sulaiman Alonna MiniumAziz Rathore, MD   3 mL at 09/30/15 2300    PE: General appearance: alert, cooperative and no distress Neck: no carotid bruit and no JVD Lungs: clear to auscultation bilaterally Heart: regular rate and rhythm, S1, S2 normal, no murmur, click, rub or gallop Extremities: no LEE Pulses: 2+ and symmetric Skin: warm and dry Neurologic: Grossly normal  Lab Results:   Recent Labs  09/30/15 1724  WBC 8.4  HGB 14.5  HCT 42.2  PLT 210   BMET  Recent Labs  09/30/15 1724 10/01/15 0419  NA 139 140  K 3.6 3.8  CL 104 106  CO2 24 23  GLUCOSE 114* 124*  BUN 16 13  CREATININE 0.92 1.04  CALCIUM 9.5 8.4*   PT/INR No results for input(s): LABPROT, INR in the last 72 hours. Cholesterol  Recent Labs  10/01/15 0419  CHOL 176   Cardiac Panel (last 3 results)  Recent Labs  09/30/15 2305 10/01/15 0419  TROPONINI 0.95* 2.50*     Assessment/Plan  Active Problems:   NSTEMI (non-ST elevated myocardial infarction) (HCC)   HLD (hyperlipidemia)   1. NSTEMI: troponin has risen to 2.50. He is still with active 5/10 CP, despite IV nitro and IV heparin.  Unable to further titrate IV nitro given soft BP in the mid 90s systolic. Will notify MD to discuss urgent cardiac cath. Continue IV heparin, nitro, ASA and high dose Lipitor.  2. HLD: FLP shows LDL at 91. TG elevated at 304. Continue high intensity statin, Lipitor 80. Recheck FLP and HFTs in 6 weeks. If significant CAD is noted on cath, goal LDL will be <70 mg/dL. May also consider adding fenofibrate for TG if not better controlled.      LOS: 1 day    Brittainy M. Delmer Islam 10/01/2015 7:50 AM  I have seen and examined the patient along with Brittainy M. Sharol Harness, PA-C.  I have reviewed the chart, notes and new data.  I agree with PA's note.  Key new complaints: ongoing, unrelenting chest discomfort radiating to back (symptoms began at work > 12 hours ago). Unable to increase antianginals due to low BP Key examination changes: +ve S4, otherwise normal CV exam Key new findings / data: Troponin increased to 2.5; subtle St elevation (<1 mm) inferior leads  PLAN: Ongoing angina in setting of probable inferior wall NSTEMI. Urgent cardiac catheterization recommended. This procedure has been fully reviewed with the patient and written informed consent has been obtained.   Thurmon Fair, MD, [facility] [phone] 10/01/2015, 8:15 AM

## 2015-10-01 NOTE — Interval H&P Note (Signed)
Cath Lab Visit (complete for each Cath Lab visit)  Clinical Evaluation Leading to the Procedure:   ACS: Yes.    Non-ACS:    Anginal Classification: CCS IV  Anti-ischemic medical therapy: No Therapy  Non-Invasive Test Results: No non-invasive testing performed  Prior CABG: No previous CABG      History and Physical Interval Note:  10/01/2015 8:56 AM  Timothy Charles  has presented today for surgery, with the diagnosis of STEMI  The various methods of treatment have been discussed with the patient and family. After consideration of risks, benefits and other options for treatment, the patient has consented to  Procedure(s): Left Heart Cath and Coronary Angiography (N/A) as a surgical intervention .  The patient's history has been reviewed, patient examined, no change in status, stable for surgery.  I have reviewed the patient's chart and labs.  Questions were answered to the patient's satisfaction.     Lesleigh NoeSMITH III,Draco Malczewski W

## 2015-10-01 NOTE — Progress Notes (Signed)
Pt trop 2.5 this am with mid sternal chest pressure 5/10.  IV ntg at 30 mcg with SBP 92-98. EKG obtained.  Dr. Loney Lohathore paged  X 2 and RR RN aware.  Will cont to monitor closely.  Pt wife at bedside.

## 2015-10-01 NOTE — Progress Notes (Signed)
  Patient Profile: 45 y/o male with PMH of HTN and spinal stenosis on pain meds who presented to [facility] ED on 09/30/15 with c/o intermittent chest pain that started earlier in the day and became worse. His initial workup showed mildly elevated tropoinin without any STE on EKG. Cardiology was consulted and patient transferred to [place]. No prior h/o CAD. Non smoker. Family h/o CAD in brother at young age. His initial trop was 0.5 and increased to 0.95>>2.50. He was started on heparin drip and nitro drip with mild constant pain.   Subjective: Still with 5/10 chest pain, described as indigesting. He feels uncomfortable. No dyspnea. He has been NPO for over 12 hrs. We discussed possibility of cath today and he is ok with proceeding.   Objective: Vital signs in last 24 hours: Temp:  [97.7 F (36.5 C)-98.2 F (36.8 C)] 97.7 F (36.5 C) (04/23 0500) Pulse Rate:  [58-81] 70 (04/23 0500) Resp:  [12-23] 21 (04/23 0500) BP: (91-159)/(61-100) 98/64 mmHg (04/23 0500) SpO2:  [96 %-100 %] 100 % (04/23 0500) Weight:  [102 lb 9.6 oz (46.539 kg)-215 lb (97.523 kg)] 102 lb 9.6 oz (46.539 kg) (04/23 0218) Last BM Date: 09/30/15  Intake/Output from previous day:   Intake/Output this shift:    Medications Current Facility-Administered Medications  Medication Dose Route Frequency Provider Last Rate Last Dose  . 0.9 %  sodium chloride infusion   Intravenous Continuous Sulaiman Aziz Rathore, MD 100 mL/hr at 10/01/15 0659 100 mL/hr at 10/01/15 0659  . acetaminophen (TYLENOL) tablet 650 mg  650 mg Oral Q6H PRN Sulaiman Aziz Rathore, MD       Or  . acetaminophen (TYLENOL) suppository 650 mg  650 mg Rectal Q6H PRN Sulaiman Aziz Rathore, MD      . aspirin EC tablet 81 mg  81 mg Oral Daily Sulaiman Aziz Rathore, MD      . atorvastatin (LIPITOR) tablet 80 mg  80 mg Oral q1800 Sulaiman Aziz Rathore, MD      . docusate sodium (COLACE) capsule 100 mg  100 mg Oral BID Sulaiman Aziz Rathore, MD   100 mg at 09/30/15  2300  . heparin ADULT infusion 100 units/mL (25000 units/250 mL)  1,400 Units/hr Intravenous Continuous Caron G Amend, RPH 14 mL/hr at 10/01/15 0530 1,400 Units/hr at 10/01/15 0530  . morphine 4 MG/ML injection 4 mg  4 mg Intravenous Q4H PRN Sulaiman Aziz Rathore, MD   4 mg at 09/30/15 2244  . nitroGLYCERIN 50 mg in dextrose 5 % 250 mL (0.2 mg/mL) infusion  5-200 mcg/min Intravenous Once Brittainy M Simmons, PA-C      . ondansetron (ZOFRAN) tablet 4 mg  4 mg Oral Q6H PRN Sulaiman Aziz Rathore, MD       Or  . ondansetron (ZOFRAN) injection 4 mg  4 mg Intravenous Q6H PRN Sulaiman Aziz Rathore, MD   4 mg at 09/30/15 2258  . pneumococcal 23 valent vaccine (PNU-IMMUNE) injection 0.5 mL  0.5 mL Intramuscular Tomorrow-1000 Sulaiman Aziz Rathore, MD      . sodium chloride flush (NS) 0.9 % injection 3 mL  3 mL Intravenous Q12H Sulaiman Aziz Rathore, MD   3 mL at 09/30/15 2300    PE: General appearance: alert, cooperative and no distress Neck: no carotid bruit and no JVD Lungs: clear to auscultation bilaterally Heart: regular rate and rhythm, S1, S2 normal, no murmur, click, rub or gallop Extremities: no LEE Pulses: 2+ and symmetric Skin: warm and dry Neurologic: Grossly normal    Lab Results:   Recent Labs  09/30/15 1724  WBC 8.4  HGB 14.5  HCT 42.2  PLT 210   BMET  Recent Labs  09/30/15 1724 10/01/15 0419  NA 139 140  K 3.6 3.8  CL 104 106  CO2 24 23  GLUCOSE 114* 124*  BUN 16 13  CREATININE 0.92 1.04  CALCIUM 9.5 8.4*   PT/INR No results for input(s): LABPROT, INR in the last 72 hours. Cholesterol  Recent Labs  10/01/15 0419  CHOL 176   Cardiac Panel (last 3 results)  Recent Labs  09/30/15 2305 10/01/15 0419  TROPONINI 0.95* 2.50*     Assessment/Plan  Active Problems:   NSTEMI (non-ST elevated myocardial infarction) (HCC)   HLD (hyperlipidemia)   1. NSTEMI: troponin has risen to 2.50. He is still with active 5/10 CP, despite IV nitro and IV heparin.  Unable to further titrate IV nitro given soft BP in the mid 90s systolic. Will notify MD to discuss urgent cardiac cath. Continue IV heparin, nitro, ASA and high dose Lipitor.  2. HLD: FLP shows LDL at 91. TG elevated at 304. Continue high intensity statin, Lipitor 80. Recheck FLP and HFTs in 6 weeks. If significant CAD is noted on cath, goal LDL will be <70 mg/dL. May also consider adding fenofibrate for TG if not better controlled.      LOS: 1 day    Brittainy M. Simmons, PA-C 10/01/2015 7:50 AM  I have seen and examined the patient along with Brittainy M. Simmons, PA-C.  I have reviewed the chart, notes and new data.  I agree with PA's note.  Key new complaints: ongoing, unrelenting chest discomfort radiating to back (symptoms began at work > 12 hours ago). Unable to increase antianginals due to low BP Key examination changes: +ve S4, otherwise normal CV exam Key new findings / data: Troponin increased to 2.5; subtle St elevation (<1 mm) inferior leads  PLAN: Ongoing angina in setting of probable inferior wall NSTEMI. Urgent cardiac catheterization recommended. This procedure has been fully reviewed with the patient and written informed consent has been obtained.   Sheron Tallman, MD, FACC [facility] [phone] 10/01/2015, 8:15 AM  

## 2015-10-01 NOTE — Progress Notes (Signed)
Chaplain responded to STEMI (delayed arrival due to shift change and immediate code blue).  Met wife Freda Munro in hallway following gurney and medical team leaving 3W enroute to cath lab.  Briefly greeted patient in elevator.  Wife appeared anxious, teary. Escorted her to waiting area and offered supportive empathy and hospitality.  Wife reported negative experience at [facility] on several occasions, a very positive experience by EMS and said that she is very glad they are here at [facility].  She is a cancer survivor (melanoma) with five years of being cancer free.  Three children ages 15-13 are at home.  Family moved here from [place] two years ago.  Wife spoke of the challenge of cultural issues (food, religion, schools) that they have experienced since moving to [facility].  They have no other family in the state, but have developed a strong network of local friends.  Family is nominally [facility]), but not regular church goers.  Following procedure, wife appeared extremely relieved and energized, saying that patient feels "100% better".  Patient reports same.  Will recommend F/U by floor chaplain.  Please call as needed before then.   Luana Shu [phone]   10/01/15 1100  Clinical Encounter Type  Visited With Patient and family together  Visit Type Initial;Follow-up;Critical Care;Psychological support  Referral From (STEMI)  Consult/Referral To Chaplain  Spiritual Encounters  Spiritual Needs Emotional  Stress Factors  Patient Stress Factors Health changes  Family Stress Factors Exhausted;Health changes;Loss of control

## 2015-10-01 NOTE — Progress Notes (Signed)
Utilization review completed.  

## 2015-10-01 NOTE — Progress Notes (Signed)
Patient complains of 3/10 right shoulder pain that started about 19:00. Vitals stable, patient looks comfortable, no nausea, no SOB. Pain does not change with movement and does not radiate. MD paged: EKG done, troponins ordered, SL nitro given (no relief of pain with the nitro). Patient has PRN norco and zanaflex that will be given. Will continue to monitor.

## 2015-10-02 ENCOUNTER — Encounter (HOSPITAL_COMMUNITY): Payer: Self-pay | Admitting: Interventional Cardiology

## 2015-10-02 ENCOUNTER — Inpatient Hospital Stay (HOSPITAL_COMMUNITY): Payer: BLUE CROSS/BLUE SHIELD

## 2015-10-02 DIAGNOSIS — I10 Essential (primary) hypertension: Secondary | ICD-10-CM

## 2015-10-02 DIAGNOSIS — I25110 Atherosclerotic heart disease of native coronary artery with unstable angina pectoris: Secondary | ICD-10-CM

## 2015-10-02 DIAGNOSIS — E785 Hyperlipidemia, unspecified: Secondary | ICD-10-CM

## 2015-10-02 DIAGNOSIS — R079 Chest pain, unspecified: Secondary | ICD-10-CM

## 2015-10-02 LAB — BASIC METABOLIC PANEL
ANION GAP: 11 (ref 5–15)
BUN: 11 mg/dL (ref 6–20)
CALCIUM: 8.3 mg/dL — AB (ref 8.9–10.3)
CO2: 22 mmol/L (ref 22–32)
Chloride: 105 mmol/L (ref 101–111)
Creatinine, Ser: 1.06 mg/dL (ref 0.61–1.24)
Glucose, Bld: 184 mg/dL — ABNORMAL HIGH (ref 65–99)
POTASSIUM: 3.6 mmol/L (ref 3.5–5.1)
Sodium: 138 mmol/L (ref 135–145)

## 2015-10-02 LAB — ECHOCARDIOGRAM COMPLETE
Height: 71 in
WEIGHTICAEL: 3440 [oz_av]

## 2015-10-02 LAB — CBC
HCT: 38.4 % — ABNORMAL LOW (ref 39.0–52.0)
Hemoglobin: 12.7 g/dL — ABNORMAL LOW (ref 13.0–17.0)
MCH: 30.2 pg (ref 26.0–34.0)
MCHC: 33.1 g/dL (ref 30.0–36.0)
MCV: 91.2 fL (ref 78.0–100.0)
PLATELETS: 170 10*3/uL (ref 150–400)
RBC: 4.21 MIL/uL — AB (ref 4.22–5.81)
RDW: 13.4 % (ref 11.5–15.5)
WBC: 8.9 10*3/uL (ref 4.0–10.5)

## 2015-10-02 LAB — POCT ACTIVATED CLOTTING TIME: Activated Clotting Time: 425 seconds

## 2015-10-02 LAB — TROPONIN I
TROPONIN I: 8.44 ng/mL — AB (ref <0.031)
TROPONIN I: 7.65 ng/mL — AB (ref <0.031)

## 2015-10-02 LAB — HEMOGLOBIN A1C
Hgb A1c MFr Bld: 5.8 % — ABNORMAL HIGH (ref 4.8–5.6)
Mean Plasma Glucose: 120 mg/dL

## 2015-10-02 MED ORDER — SODIUM CHLORIDE 0.9% FLUSH: 3.0000 mL | Freq: Two times a day (BID) | INTRAVENOUS | Status: DC

## 2015-10-02 MED ORDER — SODIUM CHLORIDE 0.9% FLUSH: 3.0000 mL | INTRAVENOUS | Status: DC | PRN

## 2015-10-02 MED ORDER — DIPHENHYDRAMINE HCL 25 MG PO CAPS
25.0000 mg | ORAL_CAPSULE | Freq: Every evening | ORAL | Status: DC | PRN
  Administered 2015-10-02 – 2015-10-04 (×2): 25 mg via ORAL
  Filled 2015-10-02 (×2): qty 1

## 2015-10-02 MED ORDER — ASPIRIN 81 MG PO CHEW
81.0000 mg | CHEWABLE_TABLET | ORAL | Status: AC
  Administered 2015-10-03: 06:00:00 81 mg via ORAL
  Filled 2015-10-02: qty 1

## 2015-10-02 MED ORDER — PNEUMOCOCCAL VAC POLYVALENT 25 MCG/0.5ML IJ INJ
0.5000 mL | INJECTION | Freq: Once | INTRAMUSCULAR | Status: AC
  Administered 2015-10-04: 0.5 mL via INTRAMUSCULAR

## 2015-10-02 MED ORDER — SODIUM CHLORIDE 0.9 % WEIGHT BASED INFUSION
1.0000 mL/kg/h | INTRAVENOUS | Status: DC
  Administered 2015-10-02: 1 mL/kg/h via INTRAVENOUS

## 2015-10-02 MED ORDER — METOPROLOL TARTRATE 12.5 MG HALF TABLET
12.5000 mg | ORAL_TABLET | Freq: Two times a day (BID) | ORAL | Status: DC
  Administered 2015-10-02 (×2): 12.5 mg via ORAL
  Filled 2015-10-02 (×2): qty 1

## 2015-10-02 MED ORDER — ASPIRIN 81 MG PO CHEW: 81.0000 mg | CHEWABLE_TABLET | ORAL | Status: DC

## 2015-10-02 MED ORDER — FLUTICASONE PROPIONATE 50 MCG/ACT NA SUSP
1.0000 | Freq: Every day | NASAL | Status: DC
  Administered 2015-10-02 – 2015-10-07 (×6): 1 via NASAL
  Filled 2015-10-02: qty 16

## 2015-10-02 MED ORDER — ANGIOPLASTY BOOK
Freq: Once | Status: AC
  Administered 2015-10-02: 22:00:00
  Filled 2015-10-02: qty 1

## 2015-10-02 MED ORDER — SODIUM CHLORIDE 0.9 % WEIGHT BASED INFUSION: 1.0000 mL/kg/h | INTRAVENOUS | Status: DC

## 2015-10-02 MED ORDER — SODIUM CHLORIDE 0.9 % IV SOLN: 250.0000 mL | INTRAVENOUS | Status: DC | PRN

## 2015-10-02 MED ORDER — SODIUM CHLORIDE 0.9 % WEIGHT BASED INFUSION
3.0000 mL/kg/h | INTRAVENOUS | Status: DC
  Administered 2015-10-03: 3 mL/kg/h via INTRAVENOUS

## 2015-10-02 NOTE — Care Management Note (Signed)
Case Management Note  Patient Details  Name: Merrilee SeashoreKeith Botkins MRN: [id number] Date of Birth: Feb 13, 1971  Subjective/Objective:        Adm w mi          Action/Plan:lives w wife   Expected Discharge Date:                Expected Discharge Plan:  Home/Self Care  In-House Referral:     Discharge planning Services  CM Consult, Medication Assistance  Post Acute Care Choice:    Choice offered to:     DME Arranged:    DME Agency:     HH Arranged:    HH Agency:     Status of Service:     Medicare Important Message Given:    Date Medicare IM Given:    Medicare IM give by:    Date Additional Medicare IM Given:    Additional Medicare Important Message give by:     If discussed at Long Length of Stay Meetings, dates discussed:    Additional Comments:ur review done. Gave pt 30day free and copay card for brilinta. Has bcbs ins.  Hanley Haysowell, Dazhane Villagomez T, RN 10/02/2015, 9:47 AM

## 2015-10-02 NOTE — Progress Notes (Signed)
  Echocardiogram 2D Echocardiogram has been performed.  Timothy SavoyCasey Charles Timothy Charles 10/02/2015, 9:14 AM

## 2015-10-02 NOTE — Progress Notes (Signed)
CARDIAC REHAB PHASE I   PRE:  Rate/Rhythm: 88 SR  BP:  Sitting: 135/86        SaO2: 96 RA  MODE:  Ambulation: 570 ft   POST:  Rate/Rhythm: 76 SR  BP:  Sitting: 128/90         SaO2: 96 RA  Pt ambulated  570 ft on RA, handheld assist, steady gait, tolerated well.  Pt c/o mild DOE, denies cp, dizziness, declined rest stop. Began MI/stent education with pt and wife at bedside.  Reviewed risk factors, tobacco cessation, anti-platelet therapy, stent card, activity restrictions, and ntg. Left heart healthy diet handout and phase 2 cardiac rehab brochure. Pt and wife verbalized understanding, receptive to education. Pt states he is "done smoking." Pt to recliner after walk, feet elevated, call bell within reach. Will follow after staged PCI.  [phone] Joylene GrapesEmily C Benisha Hadaway, RN, BSN 10/02/2015 11:08 AM

## 2015-10-02 NOTE — Progress Notes (Signed)
Patient Name: Timothy SeashoreKeith Charles Date of Encounter: 10/02/2015   45 y/o male with PMH of HTN and spinal stenosis on pain meds who [facility] with c/o intermittent chest pain that started earlier in the day and became worse. His initial workup showed mildly elevated tropoinin without any STE on EKG. Cardiology was consulted and [facility]. No prior h/o CAD. Non smoker. Family h/o CAD in brother at young age. His initial trop was 0.5 and increased to 0.95>>2.50. He was started on heparin drip and nitro drip with mild constant pain.  Taken for Urgent Cardiac CATH/PCI on 4/23 for persistent CP (see below).  Hospital Problem List      Principal Problem:   NSTEMI (non-ST elevated myocardial infarction) [facility](HCC) Active Problems:   Coronary artery disease involving native coronary artery with unstable angina pectoris (HCC)   HLD (hyperlipidemia)   Essential hypertension     Subjective   Feels better today - no more chest pressure. Does have mild L shoulder & arm pain.  No SOB, no PND/orhtopnea. No palpitations  Inpatient Medications    . aspirin  81 mg Oral Daily  . atorvastatin  80 mg Oral q1800  . docusate sodium  100 mg Oral BID  . enoxaparin (LOVENOX) injection  40 mg Subcutaneous Q24H  . loratadine  10 mg Oral Daily  . metoprolol tartrate  12.5 mg Oral BID  . pneumococcal 23 valent vaccine  0.5 mL Intramuscular Tomorrow-1000  . sodium chloride flush  3 mL Intravenous Q12H  . ticagrelor  90 mg Oral BID    Vital Signs    Filed Vitals:   10/02/15 0300 10/02/15 0400 10/02/15 0500 10/02/15 0800  BP:  92/52  123/75  Pulse: 73 71 74 72  Temp:      TempSrc:      Resp: 13 15 32 17  Height:      Weight:      SpO2: 95% 97% 97% 95%    Intake/Output Summary (Last 24 hours) at 10/02/15 0948 Last data filed at 10/02/15 0800  Gross per 24 hour  Intake 1780.44 ml  Output   2550 ml  Net -769.56 ml   Filed Weights   09/30/15 1653 10/01/15 0218  10/01/15 1010  Weight: 215 lb (97.523 kg) 102 lb 9.6 oz (46.539 kg) 215 lb (97.523 kg)    Physical Exam    General: Pleasant, NAD. Neuro: Alert and oriented X 3. Moves all extremities spontaneously. Psych: Normal affect. HEENT:  Normal  Neck: Supple without bruits or JVD. Lungs:  Resp regular and unlabored, CTA. Heart: RRR no s3, s4, or murmurs. Abdomen: Soft, non-tender, non-distended, BS + x 4.  Extremities: No clubbing, cyanosis or edema. DP/PT/Radials 2+ and equal bilaterally.  Labs    CBC  Recent Labs  09/30/15 1724 10/02/15 0215  WBC 8.4 8.9  HGB 14.5 12.7*  HCT 42.2 38.4*  MCV 88.3 91.2  PLT 210 170   Basic Metabolic Panel  Recent Labs  10/01/15 0419 10/02/15 0215  NA 140 138  K 3.8 3.6  CL 106 105  CO2 23 22  GLUCOSE 124* 184*  BUN 13 11  CREATININE 1.04 1.06  CALCIUM 8.4* 8.3*   Liver Function Tests No results for input(s): AST, ALT, ALKPHOS, BILITOT, PROT, ALBUMIN in the last 72 hours. No results for input(s): LIPASE, AMYLASE in the last 72 hours. Cardiac Enzymes  Recent Labs  10/01/15 2116 10/02/15 0215 10/02/15 0750  TROPONINI 10.10*  8.44* 7.65*   BNP Invalid input(s): POCBNP D-Dimer No results for input(s): DDIMER in the last 72 hours. Hemoglobin A1C No results for input(s): HGBA1C in the last 72 hours. Fasting Lipid Panel  Recent Labs  10/01/15 0419  CHOL 176  HDL 24*  LDLCALC 91  TRIG 161*  CHOLHDL 7.3   Thyroid Function Tests  Recent Labs  09/30/15 2305  TSH 3.191    Telemetry    NSR 80s-90s  ECG    NSR 74.  Inf MI recent/acute (evolving changes - of ? Inf STEMI), Poor anterior R wave progression - CRO Ant MI,, old.  Anterior TWI - consider Anterior Ischemia.  Cardiology: Procedures 10/01/2011   Coronary Stent Intervention   Left Heart Cath and Coronary Angiography    Conclusion    1. Prox LAD lesion, 80% stenosed. 2. Ost Ramus to Ramus lesion, 70% stenosed. 3. Prox RCA to Mid RCA lesion, 100%  stenosed. Post intervention, there is a 0% residual stenosis.   Non-ST elevation myocardial infarction due to total occlusion of the large right coronary artery which is well collateralized from the left coronary system.  Successful angioplasty, catheter-based thrombectomy, and DES stenting from 100% to 0% with TIMI grade 3 flow. Postdilatation balloon diameter of 4.5 mm.  80% mid LAD Medina 101 bifurcation stenosis. 70% proximal stenosis of the ramus intermedius.  Widely patent circumflex  Inferior wall akinesis, presumed stunned. LVEF 40-45%  Recommendations:  Aspirin and Brilinta  Staged LAD PCI on 10/03/2015 . Mendel Ryder to perform .  Aggressive risk factor modification .     Echo 2/24: Normal LV systolic and diastolic function; trace TR. - Left ventricle: The cavity size was normal. Wall thickness was normal. Systolic function was normal. EF 55% to 60%. Wall motion was normal; there were no regional wall motion abnormalities. Left  ventricular diastolic function parameters were normal. - Mitral valve: Calcified annulus.   Radiology    Dg Chest 2 View  09/30/2015  CLINICAL DATA:  LEFT-sided chest pain. EXAM: CHEST  2 VIEW COMPARISON:  05/04/2015 FINDINGS: Normal mediastinum and cardiac silhouette. Normal pulmonary vasculature. No evidence of effusion, infiltrate, or pneumothorax. No acute bony abnormality. IMPRESSION: No acute cardiopulmonary process. Electronically Signed   By: Genevive Bi M.D.   On: 09/30/2015 17:49    Assessment & Plan   Principal Problem: * NSTEMI (non-ST elevated myocardial infarction) (HCC) - suspect that his RCA was opening & closing (ECG more c/w evolving STEMI)  PCI to RCA - no RWMA on Echo  No notable arrhythmia  Active Problems * Coronary artery disease involving native coronary artery with unstable angina pectoris (HCC)  PCI of RCA 100% yesterday --> Plan staged LAD-Diag PCI tomorrow with Dr. Katrinka Blazing (Consent Attestation singed on  Epic)  His having some dyspnea spells - possible Brilinta related (monitor for now).    Continue DAPT x 1 yr  High dose Statin  Will add low dose BB today    * HLD (hyperlipidemia) -- on high dose statin    *  Essential hypertension (was initially borderline hypotensive.- adding BB  * Chronic Allergic Rhinitis -- no Pseudophed -- will Rx Flonase & Claritin.   Would be ok with transfer to TCU/Stepdown if bed needed. Ambulate with [facility]   Signed, Herbie Baltimore, Piedad Climes, M.D., M.S. Interventional Cardiologist   Pager # [phone] Phone # [phone] [address] [place], [place] [address]

## 2015-10-03 ENCOUNTER — Encounter (HOSPITAL_COMMUNITY): Payer: Self-pay | Admitting: Interventional Cardiology

## 2015-10-03 ENCOUNTER — Encounter (HOSPITAL_COMMUNITY)
Admission: EM | Disposition: A | Discharge status: Home / Self Care | Payer: Self-pay | Source: Home / Self Care | Attending: Interventional Cardiology

## 2015-10-03 DIAGNOSIS — I213 ST elevation (STEMI) myocardial infarction of unspecified site: Secondary | ICD-10-CM

## 2015-10-03 HISTORY — PX: CARDIAC CATHETERIZATION: SHX172

## 2015-10-03 LAB — CBC
HEMATOCRIT: 38.4 % — AB (ref 39.0–52.0)
HEMATOCRIT: 37.9 % — AB (ref 39.0–52.0)
Hemoglobin: 12.7 g/dL — ABNORMAL LOW (ref 13.0–17.0)
Hemoglobin: 12.8 g/dL — ABNORMAL LOW (ref 13.0–17.0)
MCH: 29.6 pg (ref 26.0–34.0)
MCH: 30 pg (ref 26.0–34.0)
MCHC: 33.1 g/dL (ref 30.0–36.0)
MCHC: 33.8 g/dL (ref 30.0–36.0)
MCV: 89.5 fL (ref 78.0–100.0)
MCV: 89 fL (ref 78.0–100.0)
PLATELETS: 179 10*3/uL (ref 150–400)
Platelets: 173 10*3/uL (ref 150–400)
RBC: 4.29 MIL/uL (ref 4.22–5.81)
RBC: 4.26 MIL/uL (ref 4.22–5.81)
RDW: 13.2 % (ref 11.5–15.5)
RDW: 13.1 % (ref 11.5–15.5)
WBC: 9 10*3/uL (ref 4.0–10.5)
WBC: 10 10*3/uL (ref 4.0–10.5)

## 2015-10-03 LAB — BASIC METABOLIC PANEL
Anion gap: 11 (ref 5–15)
BUN: 10 mg/dL (ref 6–20)
CALCIUM: 8.2 mg/dL — AB (ref 8.9–10.3)
CO2: 22 mmol/L (ref 22–32)
CREATININE: 1 mg/dL (ref 0.61–1.24)
Chloride: 105 mmol/L (ref 101–111)
GFR calc Af Amer: >60 mL/min (ref >60)
GLUCOSE: 112 mg/dL — AB (ref 65–99)
Potassium: 3.7 mmol/L (ref 3.5–5.1)
Sodium: 138 mmol/L (ref 135–145)

## 2015-10-03 LAB — TROPONIN I
TROPONIN I: 5.02 ng/mL — AB (ref <0.031)
TROPONIN I: 4.64 ng/mL — AB (ref <0.031)
Troponin I: 3.81 ng/mL (ref <0.031)

## 2015-10-03 LAB — PROTIME-INR
INR: 1.09 (ref 0.00–1.49)
Prothrombin Time: 14.3 seconds (ref 11.6–15.2)

## 2015-10-03 LAB — POCT ACTIVATED CLOTTING TIME
ACTIVATED CLOTTING TIME: 358 s
Activated Clotting Time: 291 seconds
Activated Clotting Time: 240 seconds
Activated Clotting Time: 296 seconds

## 2015-10-03 LAB — CREATININE, SERUM
Creatinine, Ser: 0.97 mg/dL (ref 0.61–1.24)
GFR calc non Af Amer: >60 mL/min (ref >60)

## 2015-10-03 LAB — HEPARIN LEVEL (UNFRACTIONATED): Heparin Unfractionated: 0.13 IU/mL — ABNORMAL LOW (ref 0.30–0.70)

## 2015-10-03 SURGERY — CORONARY STENT INTERVENTION
Anesthesia: LOCAL

## 2015-10-03 MED ORDER — MIDAZOLAM HCL 2 MG/2ML IJ SOLN
1.0000 mg | INTRAMUSCULAR | Status: DC | PRN
  Administered 2015-10-03: 1 mg via INTRAVENOUS
  Filled 2015-10-03: qty 2

## 2015-10-03 MED ORDER — MIDAZOLAM HCL 2 MG/2ML IJ SOLN
INTRAMUSCULAR | Status: AC
  Filled 2015-10-03: qty 2

## 2015-10-03 MED ORDER — HYDROMORPHONE HCL 1 MG/ML IJ SOLN
1.0000 mg | INTRAMUSCULAR | Status: DC | PRN
  Administered 2015-10-03 – 2015-10-04 (×3): 1 mg via INTRAVENOUS
  Filled 2015-10-03 (×3): qty 1

## 2015-10-03 MED ORDER — FAMOTIDINE IN NACL 20-0.9 MG/50ML-% IV SOLN
20.0000 mg | Freq: Once | INTRAVENOUS | Status: AC
  Administered 2015-10-03: 20 mg via INTRAVENOUS

## 2015-10-03 MED ORDER — HEPARIN (PORCINE) IN NACL 2-0.9 UNIT/ML-% IJ SOLN
INTRAMUSCULAR | Status: AC
  Filled 2015-10-03: qty 1500

## 2015-10-03 MED ORDER — FAMOTIDINE IN NACL 20-0.9 MG/50ML-% IV SOLN
INTRAVENOUS | Status: AC
  Filled 2015-10-03: qty 50

## 2015-10-03 MED ORDER — IOPAMIDOL (ISOVUE-370) INJECTION 76%
INTRAVENOUS | Status: DC | PRN
  Administered 2015-10-03: 215 mL via INTRA_ARTERIAL

## 2015-10-03 MED ORDER — GI COCKTAIL ~~LOC~~
30.0000 mL | Freq: Once | ORAL | Status: AC
  Administered 2015-10-04: 30 mL via ORAL
  Filled 2015-10-03: qty 30

## 2015-10-03 MED ORDER — SODIUM CHLORIDE 0.9 % WEIGHT BASED INFUSION
1.0000 mL/kg/h | INTRAVENOUS | Status: DC
Start: 2015-10-03 — End: 2015-10-04

## 2015-10-03 MED ORDER — FENTANYL CITRATE (PF) 100 MCG/2ML IJ SOLN
INTRAMUSCULAR | Status: AC
  Filled 2015-10-03: qty 2

## 2015-10-03 MED ORDER — DIAZEPAM 5 MG PO TABS
ORAL_TABLET | ORAL | Status: AC
  Filled 2015-10-03: qty 1

## 2015-10-03 MED ORDER — ONDANSETRON HCL 4 MG/2ML IJ SOLN
INTRAMUSCULAR | Status: AC
  Filled 2015-10-03: qty 2

## 2015-10-03 MED ORDER — DIAZEPAM 5 MG PO TABS
5.0000 mg | ORAL_TABLET | Freq: Two times a day (BID) | ORAL | Status: DC | PRN
  Administered 2015-10-03: 5 mg via ORAL

## 2015-10-03 MED ORDER — NITROGLYCERIN IN D5W 200-5 MCG/ML-% IV SOLN
2.0000 ug/min | INTRAVENOUS | Status: DC
Start: 2015-10-03 — End: 2015-10-04

## 2015-10-03 MED ORDER — HEPARIN (PORCINE) IN NACL 100-0.45 UNIT/ML-% IJ SOLN
1500.0000 [IU]/h | INTRAMUSCULAR | Status: DC
  Administered 2015-10-03: 1250 [IU]/h via INTRAVENOUS
  Administered 2015-10-04: 1500 [IU]/h via INTRAVENOUS
  Filled 2015-10-03 (×2): qty 250

## 2015-10-03 MED ORDER — ENOXAPARIN SODIUM 40 MG/0.4ML ~~LOC~~ SOLN: 40.0000 mg | SUBCUTANEOUS | Status: DC

## 2015-10-03 MED ORDER — IOPAMIDOL (ISOVUE-370) INJECTION 76%
INTRAVENOUS | Status: AC
  Filled 2015-10-03: qty 50

## 2015-10-03 MED ORDER — NITROGLYCERIN 1 MG/10 ML FOR IR/CATH LAB
INTRA_ARTERIAL | Status: AC
Start: 2015-10-03 — End: 2015-10-03
  Filled 2015-10-03: qty 10

## 2015-10-03 MED ORDER — HEPARIN (PORCINE) IN NACL 2-0.9 UNIT/ML-% IJ SOLN
INTRAMUSCULAR | Status: DC | PRN
  Administered 2015-10-03: 1500 mL

## 2015-10-03 MED ORDER — HEPARIN SODIUM (PORCINE) 1000 UNIT/ML IJ SOLN
INTRAMUSCULAR | Status: AC
  Filled 2015-10-03: qty 1

## 2015-10-03 MED ORDER — VERAPAMIL HCL 2.5 MG/ML IV SOLN
INTRAVENOUS | Status: AC
  Filled 2015-10-03: qty 2

## 2015-10-03 MED ORDER — HYDROMORPHONE HCL 1 MG/ML IJ SOLN
INTRAMUSCULAR | Status: AC
  Administered 2015-10-03: 1 mg via INTRAVENOUS
  Filled 2015-10-03: qty 1

## 2015-10-03 MED ORDER — LISINOPRIL 2.5 MG PO TABS
2.5000 mg | ORAL_TABLET | Freq: Every day | ORAL | Status: DC
  Administered 2015-10-03 – 2015-10-04 (×2): 2.5 mg via ORAL
  Filled 2015-10-03 (×2): qty 1

## 2015-10-03 MED ORDER — HEPARIN SODIUM (PORCINE) 1000 UNIT/ML IJ SOLN
INTRAMUSCULAR | Status: DC | PRN
  Administered 2015-10-03: 7000 [IU] via INTRAVENOUS
  Administered 2015-10-03: 2000 [IU] via INTRAVENOUS
  Administered 2015-10-03 (×2): 2500 [IU] via INTRAVENOUS

## 2015-10-03 MED ORDER — NITROGLYCERIN 1 MG/10 ML FOR IR/CATH LAB
INTRA_ARTERIAL | Status: AC
  Filled 2015-10-03: qty 10

## 2015-10-03 MED ORDER — MORPHINE SULFATE (PF) 4 MG/ML IV SOLN
INTRAVENOUS | Status: AC
  Filled 2015-10-03: qty 1

## 2015-10-03 MED ORDER — FENTANYL CITRATE (PF) 100 MCG/2ML IJ SOLN
INTRAMUSCULAR | Status: DC | PRN
  Administered 2015-10-03 (×6): 50 ug via INTRAVENOUS

## 2015-10-03 MED ORDER — CALCIUM CARBONATE ANTACID 500 MG PO CHEW
1.0000 | CHEWABLE_TABLET | Freq: Three times a day (TID) | ORAL | Status: DC | PRN
  Administered 2015-10-03 – 2015-10-04 (×3): 400 mg via ORAL
  Filled 2015-10-03 (×3): qty 2

## 2015-10-03 MED ORDER — NITROGLYCERIN 1 MG/10 ML FOR IR/CATH LAB
INTRA_ARTERIAL | Status: DC | PRN
  Administered 2015-10-03 (×3): 200 ug via INTRACORONARY

## 2015-10-03 MED ORDER — MORPHINE SULFATE (PF) 2 MG/ML IV SOLN
INTRAVENOUS | Status: AC
  Filled 2015-10-03: qty 1

## 2015-10-03 MED ORDER — ASPIRIN 81 MG PO CHEW
81.0000 mg | CHEWABLE_TABLET | Freq: Every day | ORAL | Status: DC
  Administered 2015-10-04 – 2015-10-07 (×4): 81 mg via ORAL
  Filled 2015-10-03 (×4): qty 1

## 2015-10-03 MED ORDER — HYDROMORPHONE HCL 1 MG/ML IJ SOLN
1.0000 mg | INTRAMUSCULAR | Status: DC | PRN
  Administered 2015-10-03: 1 mg via INTRAVENOUS

## 2015-10-03 MED ORDER — PROMETHAZINE HCL 25 MG/ML IJ SOLN
12.5000 mg | Freq: Once | INTRAMUSCULAR | Status: AC
  Administered 2015-10-03: 12.5 mg via INTRAVENOUS
  Filled 2015-10-03: qty 1

## 2015-10-03 MED ORDER — MORPHINE SULFATE (PF) 10 MG/ML IV SOLN
2.0000 mg | INTRAVENOUS | Status: DC | PRN
  Administered 2015-10-03: 4 mg via INTRAVENOUS
  Administered 2015-10-03 (×4): 2 mg via INTRAVENOUS

## 2015-10-03 MED ORDER — ONDANSETRON HCL 4 MG/2ML IJ SOLN
4.0000 mg | Freq: Four times a day (QID) | INTRAMUSCULAR | Status: DC | PRN
  Administered 2015-10-03 – 2015-10-04 (×3): 4 mg via INTRAVENOUS
  Filled 2015-10-03 (×2): qty 2

## 2015-10-03 MED ORDER — NITROGLYCERIN IN D5W 200-5 MCG/ML-% IV SOLN
INTRAVENOUS | Status: DC | PRN
  Administered 2015-10-03: 10 ug/min via INTRAVENOUS

## 2015-10-03 MED ORDER — TICAGRELOR 90 MG PO TABS
90.0000 mg | ORAL_TABLET | Freq: Two times a day (BID) | ORAL | Status: DC
  Administered 2015-10-03 – 2015-10-07 (×8): 90 mg via ORAL
  Filled 2015-10-03 (×9): qty 1

## 2015-10-03 MED ORDER — IOPAMIDOL (ISOVUE-370) INJECTION 76%
INTRAVENOUS | Status: AC
  Filled 2015-10-03: qty 125

## 2015-10-03 MED ORDER — LORAZEPAM 2 MG/ML IJ SOLN
1.0000 mg | Freq: Four times a day (QID) | INTRAMUSCULAR | Status: DC | PRN
  Administered 2015-10-03: 1 mg via INTRAVENOUS
  Filled 2015-10-03: qty 1

## 2015-10-03 MED ORDER — MIDAZOLAM HCL 2 MG/2ML IJ SOLN
INTRAMUSCULAR | Status: DC | PRN
  Administered 2015-10-03 (×4): 1 mg via INTRAVENOUS

## 2015-10-03 MED ORDER — ACETAMINOPHEN 325 MG PO TABS: 650.0000 mg | ORAL_TABLET | ORAL | Status: DC | PRN

## 2015-10-03 MED ORDER — LIDOCAINE HCL (PF) 1 % IJ SOLN
INTRAMUSCULAR | Status: AC
  Filled 2015-10-03: qty 30

## 2015-10-03 MED ORDER — VERAPAMIL HCL 2.5 MG/ML IV SOLN
INTRAVENOUS | Status: DC | PRN
  Administered 2015-10-03: 10 mL via INTRA_ARTERIAL

## 2015-10-03 MED ORDER — NITROGLYCERIN IN D5W 200-5 MCG/ML-% IV SOLN
INTRAVENOUS | Status: AC
  Filled 2015-10-03: qty 250

## 2015-10-03 MED ORDER — LIDOCAINE HCL (PF) 1 % IJ SOLN
INTRAMUSCULAR | Status: DC | PRN
  Administered 2015-10-03: 2 mL

## 2015-10-03 MED ORDER — SODIUM CHLORIDE 0.9% FLUSH
3.0000 mL | INTRAVENOUS | Status: DC | PRN
Start: 2015-10-03 — End: 2015-10-04

## 2015-10-03 MED ORDER — SODIUM CHLORIDE 0.9% FLUSH
3.0000 mL | Freq: Two times a day (BID) | INTRAVENOUS | Status: DC
  Administered 2015-10-03 (×2): 3 mL via INTRAVENOUS

## 2015-10-03 MED ORDER — SODIUM CHLORIDE 0.9 % IV SOLN: 250.0000 mL | INTRAVENOUS | Status: DC | PRN

## 2015-10-03 MED ORDER — TICAGRELOR 90 MG PO TABS
ORAL_TABLET | ORAL | Status: AC
  Filled 2015-10-03: qty 1

## 2015-10-03 MED ORDER — ONDANSETRON HCL 4 MG/2ML IJ SOLN: 4.0000 mg | Freq: Four times a day (QID) | INTRAMUSCULAR | Status: DC | PRN

## 2015-10-03 MED ORDER — OXYCODONE-ACETAMINOPHEN 5-325 MG PO TABS: 1.0000 | ORAL_TABLET | ORAL | Status: DC | PRN

## 2015-10-03 MED ORDER — METOPROLOL TARTRATE 25 MG PO TABS
25.0000 mg | ORAL_TABLET | Freq: Two times a day (BID) | ORAL | Status: DC
  Administered 2015-10-04 – 2015-10-07 (×6): 25 mg via ORAL
  Filled 2015-10-03 (×8): qty 1

## 2015-10-03 MED ORDER — TICAGRELOR 90 MG PO TABS
ORAL_TABLET | ORAL | Status: DC | PRN
  Administered 2015-10-03: 90 mg via ORAL

## 2015-10-03 SURGICAL SUPPLY — 21 items
BALLN EMERGE MR 2.0X12 (BALLOONS) ×2
BALLN EUPHORA RX 2.75X12 (BALLOONS) ×2
BALLN ~~LOC~~ EMERGE MR 2.25X12 (BALLOONS) ×2
BALLN ~~LOC~~ EMERGE MR 3.0X8 (BALLOONS) ×2
BALLOON EMERGE MR 2.0X12 (BALLOONS) ×1 IMPLANT
BALLOON EUPHORA RX 2.75X12 (BALLOONS) ×1 IMPLANT
BALLOON ~~LOC~~ EMERGE MR 2.25X12 (BALLOONS) ×1 IMPLANT
BALLOON ~~LOC~~ EMERGE MR 3.0X8 (BALLOONS) ×1 IMPLANT
CATH INFINITI JR4 5F (CATHETERS) ×2 IMPLANT
CATH VISTA GUIDE 6FR XBLAD3.5 (CATHETERS) ×2 IMPLANT
DEVICE RAD COMP TR BAND LRG (VASCULAR PRODUCTS) ×2 IMPLANT
GLIDESHEATH SLEND A-KIT 6F 22G (SHEATH) ×2 IMPLANT
KIT ENCORE 26 ADVANTAGE (KITS) ×6 IMPLANT
KIT HEART LEFT (KITS) ×2 IMPLANT
PACK CARDIAC CATHETERIZATION (CUSTOM PROCEDURE TRAY) ×2 IMPLANT
STENT PROMUS PREM MR 3.5X20 (Permanent Stent) ×2 IMPLANT
TRANSDUCER W/STOPCOCK (MISCELLANEOUS) ×2 IMPLANT
TUBING CIL FLEX 10 FLL-RA (TUBING) ×2 IMPLANT
WIRE HI TORQ BMW 190CM (WIRE) ×2 IMPLANT
WIRE RUNTHROUGH .014X180CM (WIRE) ×2 IMPLANT
WIRE SAFE-T 1.5MM-J .035X260CM (WIRE) ×2 IMPLANT

## 2015-10-03 NOTE — Progress Notes (Signed)
NTG drip increased from 10mcg to 20mcg per Dr. Michaelle CopasSmith's order.

## 2015-10-03 NOTE — Progress Notes (Signed)
  Checked back in on patient. He still complains of lingering chest discomfort. However RN reports that he is resting a bit more comfortably compared to before. He is however very anxious. Dr. Katrinka BlazingSmith has ordered IV ativan and IV dilaudid. Plan is for conservative management. RN to monitor closely overnight. Dr. Herbie BaltimoreHarding to round on tomorrow AM.   Sharol HarnessSIMMONS, Avon GullyBRITTAINY 10/03/2015

## 2015-10-03 NOTE — Progress Notes (Signed)
C/O headache and indigestion. Belches and states he feels better. Dr. Katrinka BlazingSmith in. Patient states morphine doesn't help. NTG drip off per order.

## 2015-10-03 NOTE — Progress Notes (Signed)
Pt up walking in hallway, c/o rt sided chest discomfort, pt states lungs feel wet, no c/o sob, EKG done, 1 nitro and 30mL Maalox given, 4L oxygen started, lungs diminished, no evidence of wetness upon auscultation, MD paged, 81mg  ASA given, will continue to monitor closely.

## 2015-10-03 NOTE — H&P (View-Only) (Signed)
 Patient Name: Timothy Charles Date of Encounter: 10/02/2015   45 y/o male with PMH of HTN and spinal stenosis on pain meds who presented to [facility] ED on 09/30/15 with c/o intermittent chest pain that started earlier in the day and became worse. His initial workup showed mildly elevated tropoinin without any STE on EKG. Cardiology was consulted and patient transferred to [place]. No prior h/o CAD. Non smoker. Family h/o CAD in brother at young age. His initial trop was 0.5 and increased to 0.95>>2.50. He was started on heparin drip and nitro drip with mild constant pain.  Taken for Urgent Cardiac CATH/PCI on 4/23 for persistent CP (see below).  Hospital Problem List      Principal Problem:   NSTEMI (non-ST elevated myocardial infarction) (HCC) Active Problems:   Coronary artery disease involving native coronary artery with unstable angina pectoris (HCC)   HLD (hyperlipidemia)   Essential hypertension     Subjective   Feels better today - no more chest pressure. Does have mild L shoulder & arm pain.  No SOB, no PND/orhtopnea. No palpitations  Inpatient Medications    . aspirin  81 mg Oral Daily  . atorvastatin  80 mg Oral q1800  . docusate sodium  100 mg Oral BID  . enoxaparin (LOVENOX) injection  40 mg Subcutaneous Q24H  . loratadine  10 mg Oral Daily  . metoprolol tartrate  12.5 mg Oral BID  . pneumococcal 23 valent vaccine  0.5 mL Intramuscular Tomorrow-1000  . sodium chloride flush  3 mL Intravenous Q12H  . ticagrelor  90 mg Oral BID    Vital Signs    Filed Vitals:   10/02/15 0300 10/02/15 0400 10/02/15 0500 10/02/15 0800  BP:  92/52  123/75  Pulse: 73 71 74 72  Temp:      TempSrc:      Resp: 13 15 32 17  Height:      Weight:      SpO2: 95% 97% 97% 95%    Intake/Output Summary (Last 24 hours) at 10/02/15 0948 Last data filed at 10/02/15 0800  Gross per 24 hour  Intake 1780.44 ml  Output   2550 ml  Net -769.56 ml   Filed Weights   09/30/15 1653 10/01/15 0218  10/01/15 1010  Weight: 215 lb (97.523 kg) 102 lb 9.6 oz (46.539 kg) 215 lb (97.523 kg)    Physical Exam    General: Pleasant, NAD. Neuro: Alert and oriented X 3. Moves all extremities spontaneously. Psych: Normal affect. HEENT:  Normal  Neck: Supple without bruits or JVD. Lungs:  Resp regular and unlabored, CTA. Heart: RRR no s3, s4, or murmurs. Abdomen: Soft, non-tender, non-distended, BS + x 4.  Extremities: No clubbing, cyanosis or edema. DP/PT/Radials 2+ and equal bilaterally.  Labs    CBC  Recent Labs  09/30/15 1724 10/02/15 0215  WBC 8.4 8.9  HGB 14.5 12.7*  HCT 42.2 38.4*  MCV 88.3 91.2  PLT 210 170   Basic Metabolic Panel  Recent Labs  10/01/15 0419 10/02/15 0215  NA 140 138  K 3.8 3.6  CL 106 105  CO2 23 22  GLUCOSE 124* 184*  BUN 13 11  CREATININE 1.04 1.06  CALCIUM 8.4* 8.3*   Liver Function Tests No results for input(s): AST, ALT, ALKPHOS, BILITOT, PROT, ALBUMIN in the last 72 hours. No results for input(s): LIPASE, AMYLASE in the last 72 hours. Cardiac Enzymes  Recent Labs  10/01/15 2116 10/02/15 0215 10/02/15 0750  TROPONINI 10.10*   8.44* 7.65*   BNP Invalid input(s): POCBNP D-Dimer No results for input(s): DDIMER in the last 72 hours. Hemoglobin A1C No results for input(s): HGBA1C in the last 72 hours. Fasting Lipid Panel  Recent Labs  10/01/15 0419  CHOL 176  HDL 24*  LDLCALC 91  TRIG 304*  CHOLHDL 7.3   Thyroid Function Tests  Recent Labs  09/30/15 2305  TSH 3.191    Telemetry    NSR 80s-90s  ECG    NSR 74.  Inf MI recent/acute (evolving changes - of ? Inf STEMI), Poor anterior R wave progression - CRO Ant MI,, old.  Anterior TWI - consider Anterior Ischemia.  Cardiology: Procedures 10/01/2011   Coronary Stent Intervention   Left Heart Cath and Coronary Angiography    Conclusion    1. Prox LAD lesion, 80% stenosed. 2. Ost Ramus to Ramus lesion, 70% stenosed. 3. Prox RCA to Mid RCA lesion, 100%  stenosed. Post intervention, there is a 0% residual stenosis.   Non-ST elevation myocardial infarction due to total occlusion of the large right coronary artery which is well collateralized from the left coronary system.  Successful angioplasty, catheter-based thrombectomy, and DES stenting from 100% to 0% with TIMI grade 3 flow. Postdilatation balloon diameter of 4.5 mm.  80% mid LAD Medina 101 bifurcation stenosis. 70% proximal stenosis of the ramus intermedius.  Widely patent circumflex  Inferior wall akinesis, presumed stunned. LVEF 40-45%  Recommendations:  Aspirin and Brilinta  Staged LAD PCI on 10/03/2015 . H Smith to perform .  Aggressive risk factor modification .     Echo 2/24: Normal LV systolic and diastolic function; trace TR. - Left ventricle: The cavity size was normal. Wall thickness was normal. Systolic function was normal. EF 55% to 60%. Wall motion was normal; there were no regional wall motion abnormalities. Left  ventricular diastolic function parameters were normal. - Mitral valve: Calcified annulus.   Radiology    Dg Chest 2 View  09/30/2015  CLINICAL DATA:  LEFT-sided chest pain. EXAM: CHEST  2 VIEW COMPARISON:  05/04/2015 FINDINGS: Normal mediastinum and cardiac silhouette. Normal pulmonary vasculature. No evidence of effusion, infiltrate, or pneumothorax. No acute bony abnormality. IMPRESSION: No acute cardiopulmonary process. Electronically Signed   By: Stewart  Edmunds M.D.   On: 09/30/2015 17:49    Assessment & Plan   Principal Problem: * NSTEMI (non-ST elevated myocardial infarction) (HCC) - suspect that his RCA was opening & closing (ECG more c/w evolving STEMI)  PCI to RCA - no RWMA on Echo  No notable arrhythmia  Active Problems * Coronary artery disease involving native coronary artery with unstable angina pectoris (HCC)  PCI of RCA 100% yesterday --> Plan staged LAD-Diag PCI tomorrow with Dr. Smith (Consent Attestation singed on  Epic)  His having some dyspnea spells - possible Brilinta related (monitor for now).    Continue DAPT x 1 yr  High dose Statin  Will add low dose BB today    * HLD (hyperlipidemia) -- on high dose statin    *  Essential hypertension (was initially borderline hypotensive.- adding BB  * Chronic Allergic Rhinitis -- no Pseudophed -- will Rx Flonase & Claritin.   Would be ok with transfer to TCU/Stepdown if bed needed. Ambulate with [facility]   Signed, Oleta Gunnoe W, M.D., M.S. Interventional Cardiologist   Pager # [phone] Phone # [phone] [address] [place], [place] [address]   

## 2015-10-03 NOTE — Progress Notes (Signed)
Wife here. Dr. Katrinka BlazingSmith at bedside talking w/patient

## 2015-10-03 NOTE — Progress Notes (Signed)
Repeat EKG done. Dr. Katrinka BlazingSmith in to see patient and look at 5312 lead.

## 2015-10-03 NOTE — Progress Notes (Signed)
Unable to void. Dr. Katrinka BlazingSmith is OK if patient uses bedside commode. CP 4/10

## 2015-10-03 NOTE — Progress Notes (Signed)
Patient was able to take his Brilinta.

## 2015-10-03 NOTE — Interval H&P Note (Signed)
Cath Lab Visit (complete for each Cath Lab visit)  Clinical Evaluation Leading to the Procedure:   ACS: No.  Non-ACS:    Anginal Classification: CCS III  Anti-ischemic medical therapy: Maximal Therapy (2 or more classes of medications)  Non-Invasive Test Results: No non-invasive testing performed  Prior CABG: No previous CABG      History and Physical Interval Note:  10/03/2015 7:17 AM  Timothy SeashoreKeith Sze  has presented today for surgery, with the diagnosis of NSTEMI  The various methods of treatment have been discussed with the patient and family. After consideration of risks, benefits and other options for treatment, the patient has consented to  Procedure(s): Coronary Stent Intervention (N/A) as a surgical intervention .  The patient's history has been reviewed, patient examined, no change in status, stable for surgery.  I have reviewed the patient's chart and labs.  Questions were answered to the patient's satisfaction.     Lesleigh NoeSMITH III,HENRY W

## 2015-10-03 NOTE — Progress Notes (Addendum)
Interventional Post Procedure Note  We are now 2 hours post-LAD stent. The patient is having ongoing 7/10 intensity chest discomfort. This is despite heavy opiate and  benzodiazepine therapy. ECG now showing Hyperacute TWA Lead 1 and AVL with inferoapical inferior TWA.  Both wife and husband are aware that I believe the diagonal (which closed acutely during the procedure and was recanalized with much difficulty but left with extensive spiral dissection) is likely totally occluded.  Options discussed were: 1.) Return to the cath lab to attempt mechanical reperfusion; 2.) Emergency coronary bypass grafting; 3.) Analgesia and medical therapy as we are currently doing, expectting small to moderate myocardial infarction.  After contemplation and review of the images, I recommended conservative management with medical therapy and analgesia. I would not recommend bypass surgery in the setting of 2 fresh stents in larger  territories to treat a small to moderate size diagonal. With the appearance of the diagonal at the completion of the case, I fear that re-intervention would only make the situation worse and potentially jeopardize the LAD territory.  Plan is conservative medical management: DAPT, beta blocker, IV NTG, IV heparin, Morphine sulfate, and benzodiazepine therapy.  Suspect anterolateral infarction due to moderate sized diagonal acute occlusion due to stent jail and spiral dissection (which occurred in an attempt to rewire the diagonal after occlusion occurred following LAD stent). Initial stent strategy was provisional stenting.  Monitor for development mechanical and ischemic complications.  Time frame of critical care management 60 minutes.

## 2015-10-03 NOTE — Progress Notes (Addendum)
Interventional Cardiology  Continuing to have lingering chest pressure, now greater than 4/10 in intensity. Discomfort has persisted now nearly 6 hours post PCI. EKG does not demonstrate ST elevation.  Analgesia switched from morphine to Dilaudid IV. The patient is resting more comfortably.  Plan continue conservative management. IV nitroglycerin discontinued because of headache.  IV heparin to be started without bolus 8 hours post sheath removal.

## 2015-10-03 NOTE — Progress Notes (Signed)
I attempted three times to give the patient his evening medications which included metoprolol and brilinta. Each time the patient stated he was still nauseous and refused. Zofran and phenergan were given. He has not had any episodes of vomiting since the phenergan, but the patient remains nauseous. Will continue to monitor.

## 2015-10-03 NOTE — Progress Notes (Signed)
ANTICOAGULATION CONSULT NOTE - Initial Consult  Pharmacy Consult for heparin Indication: chest pain/ACS  No Known Allergies  Patient Measurements: Height: 5\' 11"  (180.3 cm) Weight: 217 lb 6 oz (98.6 kg) IBW/kg (Calculated) : 75.3 Heparin Dosing Weight: 95.5 kg  Vital Signs: Temp: 97.6 F (36.4 C) (04/25 1600) Temp Source: Oral (04/25 1600) BP: 142/83 mmHg (04/25 1515) Pulse Rate: 60 (04/25 1515)  Labs:  Recent Labs  10/01/15 0419  10/02/15 0215 10/02/15 0750 10/03/15 0540 10/03/15 1100 10/03/15 1614  HGB  --   --  12.7*  --  12.7*  --  12.8*  HCT  --   --  38.4*  --  38.4*  --  37.9*  PLT  --   --  170  --  179  --  173  LABPROT  --   --   --   --  14.3  --   --   INR  --   --   --   --  1.09  --   --   HEPARINUNFRC 0.22*  --   --   --   --   --   --   CREATININE 1.04  --  1.06  --  1.00  --  0.97  TROPONINI 2.50*  < > 8.44* 7.65*  --  3.81* 5.02*  < > = values in this interval not displayed.  Estimated Creatinine Clearance: 117.5 mL/min (by C-G formula based on Cr of 0.97).   Medical History: Past Medical History  Diagnosis Date  . Allergy   . Anxiety   . Essential hypertension   . Spinal stenosis at L4-L5 level     Medications:  Prescriptions prior to admission  Medication Sig Dispense Refill Last Dose  . HYDROcodone-acetaminophen (NORCO) 5-325 MG per tablet Take 1 tablet by mouth every 6 (six) hours as needed. 30 tablet 0 09/30/2015 at Unknown time  . levocetirizine (XYZAL) 5 MG tablet Take 5 mg by mouth every evening.   09/29/2015 at Unknown time  . tiZANidine (ZANAFLEX) 2 MG tablet Take 2 mg by mouth at bedtime.   09/29/2015 at Unknown time  . azithromycin (ZITHROMAX Z-PAK) 250 MG tablet 2 po day one, then 1 daily x 4 days 5 tablet 0   . benzonatate (TESSALON) 100 MG capsule Take 1 capsule (100 mg total) by mouth every 8 (eight) hours. 21 capsule 0   . predniSONE (DELTASONE) 10 MG tablet Take 6 tablets (60 mg total) by mouth daily. 30 tablet 0   .  pseudoephedrine (SUDAFED 12 HOUR) 120 MG 12 hr tablet Take 1 tablet (120 mg total) by mouth 2 (two) times daily. 10 tablet 0     Assessment: 45 yo M with lingering 4/10 CP 6 hours s/p LAD stent.  No ST elevation on EKG.  NTG off 2nd HA.  Pharmacy consulted to start heparin with no bolus 8 hrs s/p sheath pull.  Sheath pulled at 915 am per RN, Archie Pattenonya.  Hg 12.8, PLTC 173, creat WNL.  Wt 98.6 kg.     Goal of Therapy:  Heparin level 0.3-0.7 units/ml Monitor platelets by anticoagulation protocol: Yes   Plan:  Start heparin with no bolus 8 hrs after sheath out at 1250 units/hr (start now) and check 6 hr HL Daily HL/CBC  Herby AbrahamMichelle T. Jaiyden Laur, Pharm.D. [phone] 10/03/2015 5:19 PM

## 2015-10-04 DIAGNOSIS — I237 Postinfarction angina: Secondary | ICD-10-CM

## 2015-10-04 LAB — BASIC METABOLIC PANEL
ANION GAP: 11 (ref 5–15)
BUN: 6 mg/dL (ref 6–20)
CHLORIDE: 101 mmol/L (ref 101–111)
CO2: 26 mmol/L (ref 22–32)
Calcium: 8.9 mg/dL (ref 8.9–10.3)
Creatinine, Ser: 0.95 mg/dL (ref 0.61–1.24)
Glucose, Bld: 113 mg/dL — ABNORMAL HIGH (ref 65–99)
POTASSIUM: 3.8 mmol/L (ref 3.5–5.1)
SODIUM: 138 mmol/L (ref 135–145)

## 2015-10-04 LAB — CBC
HCT: 39.4 % (ref 39.0–52.0)
HEMOGLOBIN: 13.3 g/dL (ref 13.0–17.0)
MCH: 29.8 pg (ref 26.0–34.0)
MCHC: 33.8 g/dL (ref 30.0–36.0)
MCV: 88.1 fL (ref 78.0–100.0)
PLATELETS: 188 10*3/uL (ref 150–400)
RBC: 4.47 MIL/uL (ref 4.22–5.81)
RDW: 12.8 % (ref 11.5–15.5)
WBC: 10.8 10*3/uL — AB (ref 4.0–10.5)

## 2015-10-04 LAB — HEPARIN LEVEL (UNFRACTIONATED): HEPARIN UNFRACTIONATED: 0.13 [IU]/mL — AB (ref 0.30–0.70)

## 2015-10-04 MED ORDER — ENOXAPARIN SODIUM 40 MG/0.4ML ~~LOC~~ SOLN
40.0000 mg | SUBCUTANEOUS | Status: DC
  Administered 2015-10-04 – 2015-10-06 (×3): 40 mg via SUBCUTANEOUS
  Filled 2015-10-04 (×3): qty 0.4

## 2015-10-04 MED ORDER — OXYCODONE-ACETAMINOPHEN 5-325 MG PO TABS
1.0000 | ORAL_TABLET | Freq: Four times a day (QID) | ORAL | Status: DC
  Administered 2015-10-04 (×3): 1 via ORAL
  Filled 2015-10-04 (×3): qty 1

## 2015-10-04 MED ORDER — PANTOPRAZOLE SODIUM 40 MG PO TBEC
40.0000 mg | DELAYED_RELEASE_TABLET | Freq: Every day | ORAL | Status: DC
  Administered 2015-10-05 – 2015-10-07 (×3): 40 mg via ORAL
  Filled 2015-10-04 (×3): qty 1

## 2015-10-04 MED ORDER — OXYCODONE-ACETAMINOPHEN 5-325 MG PO TABS
2.0000 | ORAL_TABLET | Freq: Four times a day (QID) | ORAL | Status: DC
  Administered 2015-10-05 – 2015-10-07 (×10): 2 via ORAL
  Filled 2015-10-04 (×10): qty 2

## 2015-10-04 MED FILL — Morphine Sulfate IV Soln PF 2 MG/ML: INTRAVENOUS | Qty: 1 | Status: AC

## 2015-10-04 MED FILL — Morphine Sulfate Inj 4 MG/ML: INTRAMUSCULAR | Qty: 1 | Status: AC

## 2015-10-04 NOTE — Progress Notes (Signed)
ANTICOAGULATION CONSULT NOTE - Follow Up Consult  Pharmacy Consult for heparin Indication: NSTEMI  Labs:  Recent Labs  10/01/15 0419  10/02/15 0215  10/03/15 0540 10/03/15 1100 10/03/15 1614 10/03/15 2132 10/04/15 0222  HGB  --   < > 12.7*  --  12.7*  --  12.8*  --  13.3  HCT  --   < > 38.4*  --  38.4*  --  37.9*  --  39.4  PLT  --   < > 170  --  179  --  173  --  188  LABPROT  --   --   --   --  14.3  --   --   --   --   INR  --   --   --   --  1.09  --   --   --   --   HEPARINUNFRC 0.22*  --   --   --   --   --   --  0.13* 0.13*  CREATININE 1.04  --  1.06  --  1.00  --  0.97  --   --   TROPONINI 2.50*  < > 8.44*  < >  --  3.81* 5.02* 4.64*  --   < > = values in this interval not displayed.   Assessment: 44yo male subtherapeutic on heparin after resumed post-cath.  Goal of Therapy:  Heparin level 0.3-0.7 units/ml   Plan:  Will increase heparin gtt by 3 units/kg/hr to 1500 units/hr and check level in 6hr.  Vernard GamblesVeronda Arvin Abello, PharmD, BCPS  10/04/2015,2:54 AM

## 2015-10-04 NOTE — Progress Notes (Signed)
Patient Name: Timothy Charles Date of Encounter: 10/04/2015   45 y/o male with PMH of HTN and spinal stenosis on pain meds who presented to [facility] ED on 09/30/15 with c/o intermittent chest pain that started earlier in the day and became worse. His initial workup showed mildly elevated tropoinin without any STE on EKG. Cardiology was consulted and patient transferred to [facility]. No prior h/o CAD. Non smoker. Family h/o CAD in brother at young age. His initial trop was 0.5 and increased to 0.95>>2.50. He was started on heparin drip and nitro drip with mild constant pain.  Taken for Urgent Cardiac CATH/PCI on 4/23 for persistent CP (see below).  Hospital Problem List      Principal Problem:   NSTEMI (non-ST elevated myocardial infarction) [facility]) Active Problems:   Coronary artery disease involving native coronary artery with unstable angina pectoris (HCC)   HLD (hyperlipidemia)   Essential hypertension    CATH - PCI RCA 4/23  Staged PCI LAD(Diag) 4/25 ->   Following PCI with potential diagonal occlusion from dissection the patient continued to have chest pain. Was seen by Dr. Katrinka Blazing yesterday afternoon and decision was made not to return to the cardiac catheterization lab as he would be unlikely able to get a stent through the LAD stent to adequately treat the diagonal. Plan was to maintain medical management. -- He suspected probable anterolateral infarction due to the diagonal that was jailed by the LAD stent. Likely had plaque shift with stent placement then attempted rewiring for provisional angioplasty resulted in likely wire related dissection.  He did have a modest elevation in his troponins from this event, but was not as high as one would expect for occluded diagonal branch. -- Type IVa MI  Was treated with nitroglycerin and IV Dilaudid overnight. However nitroglycerin causes headache was then stopped.  Plan was to start heparin post sheath removal -- this is discontinued now as well as  IV nitroglycerin.  Subjective   Still notes some nagging chest discomfort. Just generally feels sore. Little bit of exertional dyspnea walking to the bathroom today. Unsure why he is having significant ongoing pain.  Inpatient Medications    . aspirin  81 mg Oral Daily  . atorvastatin  80 mg Oral q1800  . docusate sodium  100 mg Oral BID  . enoxaparin (LOVENOX) injection  40 mg Subcutaneous Q24H  . fluticasone  1 spray Each Nare Daily  . lisinopril  2.5 mg Oral Daily  . loratadine  10 mg Oral Daily  . metoprolol tartrate  25 mg Oral BID  . oxyCODONE-acetaminophen  1 tablet Oral Q6H  . ticagrelor  90 mg Oral BID    Vital Signs    Filed Vitals:   10/04/15 0912 10/04/15 1000 10/04/15 1100 10/04/15 1200  BP: 127/77 119/78 112/67 122/81  Pulse:      Temp:      TempSrc:      Resp:      Height:      Weight:      SpO2:        Intake/Output Summary (Last 24 hours) at 10/04/15 1212 Last data filed at 10/04/15 1000  Gross per 24 hour  Intake 3461.19 ml  Output   1450 ml  Net 2011.19 ml   Filed Weights   10/01/15 1010 10/03/15 0412 10/03/15 1445  Weight: 215 lb (97.523 kg) 216 lb 0.8 oz (98 kg) 217 lb 6 oz (98.6 kg)    Physical Exam    General:  Pleasant, NAD. Neuro: Alert and oriented X 3. Moves all extremities spontaneously. Psych: Normal affect. HEENT:  Normal  Neck: Supple without bruits or JVD. Lungs:  Resp regular and unlabored, CTA. Heart: RRR no s3, s4, or murmurs. Abdomen: Soft, non-tender, non-distended, BS + x 4.  Extremities: No clubbing, cyanosis or edema. DP/PT/Radials 2+ and equal bilaterally. Radial cath site looks clean and intact  Labs    CBC  Recent Labs  10/03/15 1614 10/04/15 0222  WBC 10.0 10.8*  HGB 12.8* 13.3  HCT 37.9* 39.4  MCV 89.0 88.1  PLT 173 188   Basic Metabolic Panel  Recent Labs  10/03/15 0540 10/03/15 1614 10/04/15 0222  NA 138  --  138  K 3.7  --  3.8  CL 105  --  101  CO2 22  --  26  GLUCOSE 112*  --  113*    BUN 10  --  6  CREATININE 1.00 0.97 0.95  CALCIUM 8.2*  --  8.9   Liver Function Tests No results for input(s): AST, ALT, ALKPHOS, BILITOT, PROT, ALBUMIN in the last 72 hours. No results for input(s): LIPASE, AMYLASE in the last 72 hours. Cardiac Enzymes  Recent Labs  10/03/15 1100 10/03/15 1614 10/03/15 2132  TROPONINI 3.81* 5.02* 4.64*   BNP Invalid input(s): POCBNP D-Dimer No results for input(s): DDIMER in the last 72 hours. Hemoglobin A1C No results for input(s): HGBA1C in the last 72 hours. Fasting Lipid Panel No results for input(s): CHOL, HDL, LDLCALC, TRIG, CHOLHDL, LDLDIRECT in the last 72 hours. Thyroid Function Tests No results for input(s): TSH, T4TOTAL, T3FREE, THYROIDAB in the last 72 hours.  Invalid input(s): FREET3  Telemetry    NSR 80s-90s  ECG    NSR 74.  Inf MI recent/acute (evolving changes - of ? Inf STEMI -- now only T-wave inversions noted with minimal Q-wave), Poor anterior R wave progression - CRO Ant MI,, old.  No ST elevations noted -- 1 compared to yesterday, inferior Q waves and ST segments are more consistent with evolving changes. Anterior T-wave inversions is no longer present.  Cardiology: Procedures 10/01/2011   Coronary Stent Intervention   Left Heart Cath and Coronary Angiography    For NSTEMI   1. Prox LAD lesion, 80% stenosed @ Diag - [place] 1/0/1 2. Ost Ramus to Ramus lesion, 70% stenosed. 3. Prox RCA to Mid RCA lesion, 100% stenosed. Post intervention, there is a 0% residual stenosis. Thrombectomy & Stent Promus Prem Mr 4.0x28  Inferior wall akinesis, presumed stunned. LVEF 40-45%  Recommendations:  Aspirin and Brilinta  Staged LAD PCI on 10/03/2015 . Mendel Ryder to perform .  Aggressive risk factor modification .  CORONARY STENT INTERVENTION:  10/04/2015   LAD diagonal PCI of a [place] 1, 0, 1 bifurcation lesion complicated by acute occlusion of a moderate to large diagonal branch.  Successful LAD stenting with  90% stenosis reduced to 0%.  Re-wire and angioplasty of diagonal from 100% to less than 50% with extensive spiral dissection noted. TIMI grade 3 flow was reestablished at the end of the case.  Persistent chest discomfort following the procedure secondary to diagonal dissection.               Echo 2/24: Normal LV systolic and diastolic function; trace TR. - Left ventricle: The cavity size was normal. Wall thickness was normal. Systolic function was normal. EF 55% to 60%. Wall motion was normal; there were no regional wall motion abnormalities. Left  ventricular diastolic function parameters  were normal. - Mitral valve: Calcified annulus.   Radiology    Dg Chest 2 View  09/30/2015  CLINICAL DATA:  LEFT-sided chest pain. EXAM: CHEST  2 VIEW COMPARISON:  05/04/2015 FINDINGS: Normal mediastinum and cardiac silhouette. Normal pulmonary vasculature. No evidence of effusion, infiltrate, or pneumothorax. No acute bony abnormality. IMPRESSION: No acute cardiopulmonary process. Electronically Signed   By: Genevive BiStewart  Edmunds M.D.   On: 09/30/2015 17:49    Assessment & Plan   Principal Problem: * NSTEMI (non-ST elevated myocardial infarction) (HCC) - suspect that his RCA was opening & closing (ECG more c/w evolving STEMI)  PCI to RCA - no RWMA on Echo  No notable arrhythmia  Active Problems * Coronary artery disease involving native coronary artery with unstable angina pectoris (HCC)  PCI of RCA 100% 4/23--> Plan staged LAD-Diag PCI tomorrow with Dr. Katrinka BlazingSmith (Consent Attestation singed on Epic)  On DAPT & BB added along with Statin; on low dose ACE-I  Staged PCI to LAD-Diag with ? Diag dissection as complication & Type IVa MI; I agree with Dr. Michaelle CopasSmith's assessment that modest elevation in troponin is not consistent with an occluded diagonal branch. More likely just with the temporary occlusion perioperative period I suspect that the diagonal is open may be a small branch is closed.  We will  stop IV heparin at this point. He was not tolerating nitroglycerin, I will therefore see use Imdur.  Oral analgesia as ordered per Dr. Katrinka BlazingSmith    * HLD (hyperlipidemia) -- on high dose statin    *  Essential hypertension (was initially borderline hypotensive.- adding BB  * Chronic Allergic Rhinitis -- no Pseudophed --on Flonase & Claritin.    Based on the Type IVa MI, and concern for possible mechanical or electrical complications, we will monitor him for another 2-3 days the hospital depending on how he is doing. I suspect she will go home by that her day. Would be ok with transfer to TCU/Stepdown if bed needed. Ambulate with [facility]    Signed, Herbie BaltimoreHARDING, Piedad ClimesAVID W, M.D., M.S. Interventional Cardiologist   Pager # [phone] Phone # [phone] [address] [place], [place] [id number]

## 2015-10-04 NOTE — Progress Notes (Signed)
CARDIAC REHAB PHASE I   PRE:  Rate/Rhythm: 87 SR  BP:  Supine:   Sitting: 103/60  Standing:    SaO2: 97%RA  MODE:  Ambulation: 600 ft   POST:  Rate/Rhythm: 86 SR  BP:  Supine:   Sitting: 108/72  Standing:    SaO2: 99%RA  1350-1448 Pt stated chest discomfort 6 on scale of 1-10 and did not worsen with walk. Walked 600 ft with asst x1 with steady gait. C/o feeling a little SOB but sats good. Dr Katrinka BlazingSmith in to assess pt after walk. To recliner. MI education completed with pt and wife who voiced understanding. Stressed importance of brilinta with stent and wife has brilinta card. Reviewed NTG use, ex ed, MI restrictions, smoking cessation and heart healthy diet. Discussed CRP 2 and referring to [place]. Luetta Nuttingharlene Elleana Stillson, RN BSN  10/04/2015 2:43 PM

## 2015-10-04 NOTE — Progress Notes (Signed)
1113 Checked on pt to walk. Pt sleeping and wife asks that I return. Will follow up later and let pt sleep. Timothy NuttingCharlene Gurtaj Ruz RN BSN 10/04/2015 11:14 AM

## 2015-10-04 NOTE — Progress Notes (Signed)
Interventional Cardiology  Feels better than yesterday but still having pain.  VS stable. No pericardial rub. Neck veins are flat.  ECG not significantly different than prior tracings pre-procedure.  Troponin I secondary bump atop resolving levels from presenting NSTEMI, peaked at only 5.02.  Impression: Perhaps the diagonal is still open rather than occluded. Ongoing pain is mysterious after 24 hours.  Plan: DC IV heparin; DVT Lovenox; Stop IV NTG; Oral therapy including analgesia; Hospital course will require an additional 2-3 days based on course.

## 2015-10-05 MED ORDER — ISOSORBIDE MONONITRATE ER 30 MG PO TB24
30.0000 mg | ORAL_TABLET | Freq: Every day | ORAL | Status: DC
  Administered 2015-10-05 – 2015-10-07 (×3): 30 mg via ORAL
  Filled 2015-10-05 (×3): qty 1

## 2015-10-05 MED ORDER — HYDROCODONE-ACETAMINOPHEN 5-325 MG PO TABS: 1.0000 | ORAL_TABLET | Freq: Four times a day (QID) | ORAL | Status: DC

## 2015-10-05 NOTE — Progress Notes (Signed)
CARDIAC REHAB PHASE I   Up in hall walking with wife   MODE:  Ambulation: 1400 ft   POST:  Rate/Rhythm: 80 SR  BP:  Supine:   Sitting: 111/71  Standing:    SaO2: 96%RA 0900-0919 Finished walk with pt. He looks and feels much better. Stated he is comfortable and not much chest discomfort at all. Walked 1400 ft. Encouraged pt to rest at least 3 hours between walks and walk not any more than 4 laps which is what he just did. Tolerated well. No questions re education done. Wants to see case manager.   Luetta Nuttingharlene Abeer Iversen, RN BSN  10/05/2015 9:14 AM

## 2015-10-05 NOTE — Care Management Note (Signed)
Case Management Note  Patient Details  Name: Timothy Charles MRN: [id number] Date of Birth: 10/06/1970  Subjective/Objective:      Pt and wife state pt will be unable to work for several months and request names of programs that will support them and their children during that time.  Advised them to go to DSS and apply for any available assistance, also advised them to call pt's employer and inquire about whether he is covered by disability insurance while he is unable to work.    Per CM Junius Creamerebbie Dowell, Gave pt 30day free and copay card for brilinta. Has bcbs ins.                Expected Discharge Plan:  Home/Self Care  Discharge planning Services  CM Consult, Medication Assistance  Roque Schill, Chapman FitchHenrietta T, [place]RN 10/05/2015, 2:51 PM

## 2015-10-05 NOTE — Progress Notes (Signed)
Patient Name: Timothy Charles Date of Encounter: 10/05/2015   45 y/o male with PMH of HTN and spinal stenosis on pain meds who presented to [facility] ED on 09/30/15 with c/o intermittent chest pain that started earlier in the day and became worse. His initial workup showed mildly elevated tropoinin without any STE on EKG. Cardiology was consulted and patient transferred to [facility]. No prior h/o CAD. Non smoker. Family h/o CAD in brother at young age. His initial trop was 0.5 and increased to 0.95>>2.50. He was started on heparin drip and nitro drip with mild constant pain.  Taken for Urgent Cardiac CATH/PCI on 4/23 for persistent CP (see below).  Hospital Problem List      Principal Problem:   NSTEMI (non-ST elevated myocardial infarction) [facility]) Active Problems:   Coronary artery disease involving native coronary artery with unstable angina pectoris (HCC)   Post-infarction angina (HCC)   HLD (hyperlipidemia)   Essential hypertension    CATH - PCI RCA 4/23  Staged PCI LAD(Diag) 4/25 ->   Following PCI with potential diagonal occlusion from dissection the patient continued to have chest pain. Was seen by Dr. Katrinka Blazing yesterday afternoon and decision was made not to return to the cardiac catheterization lab as he would be unlikely able to get a stent through the LAD stent to adequately treat the diagonal. Plan was to maintain medical management. -- He suspected probable anterolateral infarction due to the diagonal that was jailed by the LAD stent. Likely had plaque shift with stent placement then attempted rewiring for provisional angioplasty resulted in likely wire related dissection.  He did have a modest elevation in his troponins from this event, but was not as high as one would expect for occluded diagonal branch. -- Type IVa MI  Subjective   Still notes some nagging chest discomfort, but feels much better today. Walking laps in unit this AM.   No notable dyspnea  Inpatient Medications      . aspirin  81 mg Oral Daily  . atorvastatin  80 mg Oral q1800  . docusate sodium  100 mg Oral BID  . enoxaparin (LOVENOX) injection  40 mg Subcutaneous Q24H  . fluticasone  1 spray Each Nare Daily  . HYDROcodone-acetaminophen  1 tablet Oral Q6H  . isosorbide mononitrate  30 mg Oral Daily  . loratadine  10 mg Oral Daily  . metoprolol tartrate  25 mg Oral BID  . oxyCODONE-acetaminophen  2 tablet Oral Q6H  . pantoprazole  40 mg Oral Q0600  . ticagrelor  90 mg Oral BID    Vital Signs    Filed Vitals:   10/05/15 0000 10/05/15 0100 10/05/15 0427 10/05/15 0800  BP: 104/60  Pulse: 61 60 57 66  Temp: 98.8 F (37.1 C)  98.4 F (36.9 C) 98.4 F (36.9 C)  TempSrc: Axillary  Oral Oral  Resp:    16  Height:      Weight:      SpO2: 96% 94% 97% 94%    Intake/Output Summary (Last 24 hours) at 10/05/15 0930 Last data filed at 10/04/15 1000  Gross per 24 hour  Intake    240 ml  Output      0 ml  Net    240 ml   Filed Weights   10/01/15 1010 10/03/15 0412 10/03/15 1445  Weight: 215 lb (97.523 kg) 216 lb 0.8 oz (98 kg) 217 lb 6 oz (98.6 kg)    Physical Exam    General:  Pleasant, NAD. Neuro: Alert and oriented X 3. Moves all extremities spontaneously. Psych: Normal affect. HEENT:  Normal  Neck: Supple without bruits or JVD. Lungs:  Resp regular and unlabored, CTA. Heart: RRR no s3, s4, or murmurs. Abdomen: Soft, non-tender, non-distended, BS + x 4.  Extremities: No clubbing, cyanosis or edema. DP/PT/Radials 2+ and equal bilaterally. Radial cath site looks clean and intact  Labs    CBC  Recent Labs  10/03/15 1614 10/04/15 0222  WBC 10.0 10.8*  HGB 12.8* 13.3  HCT 37.9* 39.4  MCV 89.0 88.1  PLT 173 188   Basic Metabolic Panel  Recent Labs  10/03/15 0540 10/03/15 1614 10/04/15 0222  NA 138  --  138  K 3.7  --  3.8  CL 105  --  101  CO2 22  --  26  GLUCOSE 112*  --  113*  BUN 10  --  6  CREATININE 1.00 0.97 0.95  CALCIUM 8.2*  --  8.9    Liver Function Tests No results for input(s): AST, ALT, ALKPHOS, BILITOT, PROT, ALBUMIN in the last 72 hours. No results for input(s): LIPASE, AMYLASE in the last 72 hours. Cardiac Enzymes  Recent Labs  10/03/15 1100 10/03/15 1614 10/03/15 2132  TROPONINI 3.81* 5.02* 4.64*   BNP Invalid input(s): POCBNP D-Dimer No results for input(s): DDIMER in the last 72 hours. Hemoglobin A1C No results for input(s): HGBA1C in the last 72 hours. Fasting Lipid Panel No results for input(s): CHOL, HDL, LDLCALC, TRIG, CHOLHDL, LDLDIRECT in the last 72 hours. Thyroid Function Tests No results for input(s): TSH, T4TOTAL, T3FREE, THYROIDAB in the last 72 hours.  Invalid input(s): FREET3  Telemetry    NSR 80s-90s  ECG   No new EKG   Cardiology: Procedures 10/01/2011   Coronary Stent Intervention   Left Heart Cath and Coronary Angiography    For NSTEMI   1. Prox LAD lesion, 80% stenosed @ Diag - [place] 1/0/1 2. Ost Ramus to Ramus lesion, 70% stenosed. 3. Prox RCA to Mid RCA lesion, 100% stenosed. Post intervention, there is a 0% residual stenosis. Thrombectomy & Stent Promus Prem Mr 4.0x28  Inferior wall akinesis, presumed stunned. LVEF 40-45%  Recommendations:  Aspirin and Brilinta  Staged LAD PCI on 10/03/2015 . Mendel RyderH Smith to perform .  Aggressive risk factor modification .  CORONARY STENT INTERVENTION:  10/04/2015   LAD diagonal PCI of a [place] 1, 0, 1 bifurcation lesion complicated by acute occlusion of a moderate to large diagonal branch.  Successful LAD stenting with 90% stenosis reduced to 0%.  Re-wire and angioplasty of diagonal from 100% to less than 50% with extensive spiral dissection noted. TIMI grade 3 flow was reestablished at the end of the case.  Persistent chest discomfort following the procedure secondary to diagonal dissection.               Echo 2/24: Normal LV systolic and diastolic function; trace TR. - Left ventricle: The cavity size was  normal. Wall thickness was normal. Systolic function was normal. EF 55% to 60%. Wall motion was normal; there were no regional wall motion abnormalities. Left  ventricular diastolic function parameters were normal. - Mitral valve: Calcified annulus.   Radiology    Dg Chest 2 View  09/30/2015  CLINICAL DATA:  LEFT-sided chest pain. EXAM: CHEST  2 VIEW COMPARISON:  05/04/2015 FINDINGS: Normal mediastinum and cardiac silhouette. Normal pulmonary vasculature. No evidence of effusion, infiltrate, or pneumothorax. No acute bony abnormality. IMPRESSION: No acute cardiopulmonary process.  Electronically Signed   By: Genevive Bi M.D.   On: 09/30/2015 17:49    Assessment & Plan   Principal Problem: * NSTEMI (non-ST elevated myocardial infarction) (HCC) - suspect that his RCA was opening & closing (ECG more c/w evolving STEMI)  PCI to RCA - no RWMA on Echo  No notable arrhythmia  On DAPT & BB added along with Statin; on low dose ACE-I  Active Problems * Coronary artery disease involving native coronary artery with unstable angina pectoris (HCC)  PCI of RCA 100% 4/23--> Staged PCI to LAD-Diag with ? Diag dissection as complication & Type IVa MI; suspect that a small branch of the Diagonal may have occluded, but with minimal "bump" in Troponin, I suspect that he main Diagonal is open. For continued CP - off NTG & started Imdur.  PRN Norco.    * HLD (hyperlipidemia) -- on high dose statin    *  Essential hypertension (was initially borderline hypotensive.- with BB, BP now somewhat decreased -- will hold ACE-I (prefer BB for now given re-infarction & concern for potential arrhythmia.  * Chronic Allergic Rhinitis -- no Pseudophed --on Flonase & Claritin.    Based on the Type IVa MI, and concern for possible mechanical or electrical complications, we will monitor him for another 2-3 days the hospital depending on how he is doing. I suspect she will go home by that her day. Have ordered PRN  Norco for oral analgesia.  Would be ok with transfer to [facility] -- orders written Ambulate with [facility]    Signed, Herbie Baltimore, Piedad Climes, M.D., M.S. Interventional Cardiologist   Pager # [phone] Phone # [phone] [address] [place], [place] [address]

## 2015-10-06 MED ORDER — MAGNESIUM HYDROXIDE 400 MG/5ML PO SUSP
30.0000 mL | Freq: Once | ORAL | Status: AC
Start: 2015-10-06 — End: 2015-10-06
  Administered 2015-10-06: 30 mL via ORAL
  Filled 2015-10-06: qty 30

## 2015-10-06 NOTE — Progress Notes (Signed)
1230 Since pt can walk independently and education completed, will sign off. Wife states probable d/c tomorrow. Luetta NuttingCharlene Aleda Madl RN BSN 10/06/2015 12:29 PM

## 2015-10-06 NOTE — Progress Notes (Signed)
Patient Name: Timothy Charles Date of Encounter: 10/06/2015   45 y/o male with PMH of HTN and spinal stenosis on pain meds who presented to [facility] ED on 09/30/15 with c/o intermittent chest pain that started earlier in the day and became worse. His initial workup showed mildly elevated tropoinin without any STE on EKG. Cardiology was consulted and patient transferred to [facility]. No prior h/o CAD. Non smoker. Family h/o CAD in brother at young age. His initial trop was 0.5 and increased to 0.95>>2.50. He was started on heparin drip and nitro drip with mild constant pain.  Taken for Urgent Cardiac CATH/PCI on 4/23 for persistent CP (see below).  Hospital Problem List      Principal Problem:   NSTEMI (non-ST elevated myocardial infarction) [facility]) Active Problems:   Coronary artery disease involving native coronary artery with unstable angina pectoris (HCC)   Post-infarction angina (HCC)   HLD (hyperlipidemia)   Essential hypertension    CATH - PCI RCA 4/23  Staged PCI LAD(Diag) 4/25 ->   He did have a modest elevation in his troponins from this event, but was not as high as one would expect for occluded diagonal branch. 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  Subjective    He did much better today with walking. Just a little bit tired since he did not get much sleep last night.   Inpatient Medications    . aspirin  81 mg Oral Daily  . atorvastatin  80 mg Oral q1800  . docusate sodium  100 mg Oral BID  . enoxaparin (LOVENOX) injection  40 mg Subcutaneous Q24H  . fluticasone  1 spray Each Nare Daily  . isosorbide mononitrate  30 mg Oral Daily  . loratadine  10 mg Oral Daily  . metoprolol tartrate  25 mg Oral BID  . oxyCODONE-acetaminophen  2 tablet Oral Q6H  . pantoprazole  40 mg Oral Q0600  . ticagrelor  90 mg Oral BID    Vital Signs    Filed Vitals:   10/05/15 2122 10/06/15 0305 10/06/15 0626 10/06/15 0903  BP: 106/62  119/84 123/75    Pulse: 79   91  Temp: 98.5 F (36.9 C)  98.1 F (36.7 C)   TempSrc: Oral  Oral   Resp: 18  16   Height:      Weight:  212 lb (96.163 kg)    SpO2: 98%  99%     Intake/Output Summary (Last 24 hours) at 10/06/15 1327 Last data filed at 10/06/15 0100  Gross per 24 hour  Intake   1200 ml  Output      0 ml  Net   1200 ml   Filed Weights   10/03/15 0412 10/03/15 1445 10/06/15 0305  Weight: 216 lb 0.8 oz (98 kg) 217 lb 6 oz (98.6 kg) 212 lb (96.163 kg)    Physical Exam    General: Pleasant, NAD. Neuro: Alert and oriented X 3. Moves all extremities spontaneously. Psych: Normal affect. HEENT:  Normal  Neck: Supple without bruits or JVD. Lungs:  Resp regular and unlabored, CTA. Heart: RRR no s3, s4, or murmurs. Abdomen: Soft, non-tender, non-distended, BS + x 4.  Extremities: No clubbing, cyanosis or edema. DP/PT/Radials 2+ and equal bilaterally. Radial cath site looks clean and intact  Labs    CBC  Recent Labs  10/03/15 1614 10/04/15 0222  WBC 10.0 10.8*  HGB 12.8* 13.3  HCT 37.9* 39.4  MCV 89.0 88.1  PLT 173 188   Basic Metabolic Panel  Recent Labs  10/03/15 1614 10/04/15 0222  NA  --  138  K  --  3.8  CL  --  101  CO2  --  26  GLUCOSE  --  113*  BUN  --  6  CREATININE 0.97 0.95  CALCIUM  --  8.9   Liver Function Tests No results for input(s): AST, ALT, ALKPHOS, BILITOT, PROT, ALBUMIN in the last 72 hours. No results for input(s): LIPASE, AMYLASE in the last 72 hours. Cardiac Enzymes  Recent Labs  10/03/15 1614 10/03/15 2132  TROPONINI 5.02* 4.64*   BNP Invalid input(s): POCBNP D-Dimer No results for input(s): DDIMER in the last 72 hours. Hemoglobin A1C No results for input(s): HGBA1C in the last 72 hours. Fasting Lipid Panel No results for input(s): CHOL, HDL, LDLCALC, TRIG, CHOLHDL, LDLDIRECT in the last 72 hours. Thyroid Function Tests No results for input(s): TSH, T4TOTAL, T3FREE, THYROIDAB in the last 72 hours.  Invalid input(s):  FREET3  Telemetry    NSR 80s-90s  ECG   No new EKG   Cardiology: Procedures 10/01/2011   Coronary Stent Intervention   Left Heart Cath and Coronary Angiography    For NSTEMI   1. Prox LAD lesion, 80% stenosed @ Diag - Medina 1/0/1 2. Ost Ramus to Ramus lesion, 70% stenosed. 3. Prox RCA to Mid RCA lesion, 100% stenosed. Post intervention, there is a 0% residual stenosis. Thrombectomy & Stent Promus Prem Mr 4.0x28  Inferior wall akinesis, presumed stunned. LVEF 40-45%  Recommendations:  Aspirin and Brilinta  Staged LAD PCI on 10/03/2015 . Mendel Ryder to perform .  Aggressive risk factor modification .  CORONARY STENT INTERVENTION:  10/04/2015   LAD diagonal PCI of a Medina 1, 0, 1 bifurcation lesion complicated by acute occlusion of a moderate to large diagonal branch.  Successful LAD stenting with 90% stenosis reduced to 0%.  Re-wire and angioplasty of diagonal from 100% to less than 50% with extensive spiral dissection noted. TIMI grade 3 flow was reestablished at the end of the case.  Persistent chest discomfort following the procedure secondary to diagonal dissection.               Echo 2/24: Normal LV systolic and diastolic function; trace TR. - Left ventricle: The cavity size was normal. Wall thickness was normal. Systolic function was normal. EF 55% to 60%. Wall motion was normal; there were no regional wall motion abnormalities. Left  ventricular diastolic function parameters were normal. - Mitral valve: Calcified annulus.   Radiology    Dg Chest 2 View  09/30/2015  CLINICAL DATA:  LEFT-sided chest pain. EXAM: CHEST  2 VIEW COMPARISON:  05/04/2015 FINDINGS: Normal mediastinum and cardiac silhouette. Normal pulmonary vasculature. No evidence of effusion, infiltrate, or pneumothorax. No acute bony abnormality. IMPRESSION: No acute cardiopulmonary process. Electronically Signed   By: Genevive Bi M.D.   On: 09/30/2015 17:49    Assessment & Plan    Principal Problem: * NSTEMI (non-ST elevated myocardial infarction) (HCC) - suspect that his RCA was opening & closing (ECG more c/w evolving STEMI)  PCI to RCA - no RWMA on Echo  No notable arrhythmia  On DAPT & BB added along with Statin; on low dose ACE-I  Active Problems * Coronary artery disease involving native coronary artery with unstable angina pectoris (HCC)  PCI of RCA 100% 4/23--> Staged PCI to LAD-Diag with ? Diag dissection as complication & Type IVa  MI;   For continued CP - off NTG & started Imdur.  PRN Norco.    * HLD (hyperlipidemia) -- on high dose statin    *  Essential hypertension (was initially borderline hypotensive.- with BB, BP now somewhat decreased -- held ACE-I (prefer BB for now given re-infarction & concern for potential arrhythmia. - he is feeling better and less dizzy fatigue off of the ACE inhibitor. I will likely hold that until the outpatient setting.  * Chronic Allergic Rhinitis -- no Pseudophed --on Flonase & Claritin.   Based on the Type IVa MI, and concern for possible mechanical or electrical complications, we will monitor him for another 2-3 days the hospital depending on how he is doing. I suspect she will go home by that her day. Have ordered PRN Norco for oral analgesia.  Doing well today. I anticipate that he be ready for discharge tomorrow.    Signed, Marykay LexHARDING, Dashiel Bergquist W, M.D., M.S. Interventional Cardiologist   Pager # [phone] Phone # [phone] [address] [place], [place] [address]

## 2015-10-07 LAB — BASIC METABOLIC PANEL
Anion gap: 12 (ref 5–15)
BUN: 11 mg/dL (ref 6–20)
CALCIUM: 8.8 mg/dL — AB (ref 8.9–10.3)
CO2: 24 mmol/L (ref 22–32)
Chloride: 101 mmol/L (ref 101–111)
Creatinine, Ser: 0.96 mg/dL (ref 0.61–1.24)
GFR calc Af Amer: >60 mL/min (ref >60)
GFR calc non Af Amer: >60 mL/min (ref >60)
GLUCOSE: 102 mg/dL — AB (ref 65–99)
Potassium: 4.3 mmol/L (ref 3.5–5.1)
Sodium: 137 mmol/L (ref 135–145)

## 2015-10-07 MED ORDER — TICAGRELOR 90 MG PO TABS: 90.0000 mg | ORAL_TABLET | Freq: Two times a day (BID) | ORAL | Status: AC

## 2015-10-07 MED ORDER — TICAGRELOR 90 MG PO TABS: 90.0000 mg | ORAL_TABLET | Freq: Two times a day (BID) | ORAL | Status: DC

## 2015-10-07 MED ORDER — PANTOPRAZOLE SODIUM 40 MG PO TBEC: 40.0000 mg | DELAYED_RELEASE_TABLET | Freq: Every day | ORAL | Status: DC

## 2015-10-07 MED ORDER — METOPROLOL TARTRATE 25 MG PO TABS: 25.0000 mg | ORAL_TABLET | Freq: Two times a day (BID) | ORAL | Status: AC

## 2015-10-07 MED ORDER — NITROGLYCERIN 0.4 MG SL SUBL: 0.4000 mg | SUBLINGUAL_TABLET | SUBLINGUAL | Status: DC | PRN

## 2015-10-07 MED ORDER — ASPIRIN 81 MG PO CHEW: 81.0000 mg | CHEWABLE_TABLET | Freq: Every day | ORAL | Status: AC

## 2015-10-07 MED ORDER — PANTOPRAZOLE SODIUM 40 MG PO TBEC: 40.0000 mg | DELAYED_RELEASE_TABLET | Freq: Every day | ORAL | Status: AC

## 2015-10-07 MED ORDER — METOPROLOL TARTRATE 25 MG PO TABS: 25.0000 mg | ORAL_TABLET | Freq: Two times a day (BID) | ORAL | Status: DC

## 2015-10-07 MED ORDER — ATORVASTATIN CALCIUM 80 MG PO TABS: 80.0000 mg | ORAL_TABLET | Freq: Every day | ORAL | Status: AC

## 2015-10-07 MED ORDER — ISOSORBIDE MONONITRATE ER 30 MG PO TB24: 30.0000 mg | ORAL_TABLET | Freq: Every day | ORAL | Status: DC

## 2015-10-07 NOTE — Discharge Instructions (Signed)
PLEASE REMEMBER TO BRING ALL OF YOUR MEDICATIONS TO EACH OF YOUR FOLLOW-UP OFFICE VISITS. ° °PLEASE ATTEND ALL SCHEDULED FOLLOW-UP APPOINTMENTS.  ° °Activity: Increase activity slowly as tolerated. You may shower, but no soaking baths (or swimming) for 1 week. No driving for 1 week. No lifting over 5 lbs for 2 weeks. No sexual activity for 1 week.  ° °You May Return to Work: in 3 weeks (if applicable) ° °Wound Care: You may wash cath site gently with soap and water. Keep cath site clean and dry. If you notice pain, swelling, bleeding or pus at your cath site, please call [phone]. ° ° ° °Cardiac Cath Site Care °Refer to this sheet in the next few weeks. These instructions provide you with information on caring for yourself after your procedure. Your caregiver may also give you more specific instructions. Your treatment has been planned according to current medical practices, but problems sometimes occur. Call your caregiver if you have any problems or questions after your procedure. °HOME CARE INSTRUCTIONS °· You may shower 24 hours after the procedure. Remove the bandage (dressing) and gently wash the site with plain soap and water. Gently pat the site dry.  °· Do not apply powder or lotion to the site.  °· Do not sit in a bathtub, swimming pool, or whirlpool for 5 to 7 days.  °· No bending, squatting, or lifting anything over 10 pounds (4.5 kg) as directed by your caregiver.  °· Inspect the site at least twice daily.  °· Do not drive home if you are discharged the same day of the procedure. Have someone else drive you.  °· You may drive 24 hours after the procedure unless otherwise instructed by your caregiver.  °What to expect: °· Any bruising will usually fade within 1 to 2 weeks.  °· Blood that collects in the tissue (hematoma) may be painful to the touch. It should usually decrease in size and tenderness within 1 to 2 weeks.  °SEEK IMMEDIATE MEDICAL CARE IF: °· You have unusual pain at the site or down the  affected limb.  °· You have redness, warmth, swelling, or pain at the site.  °· You have drainage (other than a small amount of blood on the dressing).  °· You have chills.  °· You have a fever or persistent symptoms for more than 72 hours.  °· You have a fever and your symptoms suddenly get worse.  °· Your leg becomes pale, cool, tingly, or numb.  °· You have heavy bleeding from the site. Hold pressure on the site.  °Document Released: 06/29/2010 Document Revised: 05/16/2011 Document Reviewed:  ° °

## 2015-10-07 NOTE — Progress Notes (Signed)
SUBJECTIVE:  Ambulating in the hall with no problems. Denies CP or SOB  OBJECTIVE:   Vitals:   Filed Vitals:   10/06/15 0626 10/06/15 0903 10/06/15 2121 10/07/15 0552  BP: 119/84 123/75 124/62 126/62  Pulse:  91 76 68  Temp: 98.1 F (36.7 C)  98.5 F (36.9 C) 97.8 F (36.6 C)  TempSrc: Oral  Oral Oral  Resp: 16  18 18   Height:      Weight:    214 lb 3.2 oz (97.16 kg)  SpO2: 99%  94% 100%   I&O's:   Intake/Output Summary (Last 24 hours) at 10/07/15 1054 Last data filed at 10/07/15 1040  Gross per 24 hour  Intake    780 ml  Output      0 ml  Net    780 ml   TELEMETRY: Reviewed telemetry pt in NSR:     PHYSICAL EXAM General: Well developed, well nourished, in no acute distress Head: Eyes PERRLA, No xanthomas.   Normal cephalic and atramatic  Lungs:   Clear bilaterally to auscultation and percussion. Heart:   HRRR S1 S2 Pulses are 2+ & equal. Abdomen: Bowel sounds are positive, abdomen soft and non-tender without masses  Extremities:   No clubbing, cyanosis or edema.  DP +1 Neuro: Alert and oriented X 3. Psych:  Good affect, responds appropriately   LABS: Basic Metabolic Panel: No results for input(s): NA, K, CL, CO2, GLUCOSE, BUN, CREATININE, CALCIUM, MG, PHOS in the last 72 hours. Liver Function Tests: No results for input(s): AST, ALT, ALKPHOS, BILITOT, PROT, ALBUMIN in the last 72 hours. No results for input(s): LIPASE, AMYLASE in the last 72 hours. CBC: No results for input(s): WBC, NEUTROABS, HGB, HCT, MCV, PLT in the last 72 hours. Cardiac Enzymes: No results for input(s): CKTOTAL, CKMB, CKMBINDEX, TROPONINI in the last 72 hours. BNP: Invalid input(s): POCBNP D-Dimer: No results for input(s): DDIMER in the last 72 hours. Hemoglobin A1C: No results for input(s): HGBA1C in the last 72 hours. Fasting Lipid Panel: No results for input(s): CHOL, HDL, LDLCALC, TRIG, CHOLHDL, LDLDIRECT in the last 72 hours. Thyroid Function Tests: No results for input(s):  TSH, T4TOTAL, T3FREE, THYROIDAB in the last 72 hours.  Invalid input(s): FREET3 Anemia Panel: No results for input(s): VITAMINB12, FOLATE, FERRITIN, TIBC, IRON, RETICCTPCT in the last 72 hours. Coag Panel:   Lab Results  Component Value Date   INR 1.09 10/03/2015    RADIOLOGY: Dg Chest 2 View  09/30/2015  CLINICAL DATA:  LEFT-sided chest pain. EXAM: CHEST  2 VIEW COMPARISON:  05/04/2015 FINDINGS: Normal mediastinum and cardiac silhouette. Normal pulmonary vasculature. No evidence of effusion, infiltrate, or pneumothorax. No acute bony abnormality. IMPRESSION: No acute cardiopulmonary process. Electronically Signed   By: Genevive BiStewart  Edmunds M.D.   On: 09/30/2015 17:49    Assessment & Plan   Principal Problem: * NSTEMI (non-ST elevated myocardial infarction) (HCC) - suspect that his RCA was opening & closing (ECG more c/w evolving STEMI).  Trop trending downward.    PCI to RCA - no RWMA on Echo  No notable arrhythmia  On DAPT & BB  And high dose statin  Active Problems * Coronary artery disease involving native coronary artery with unstable angina pectoris (HCC)  PCI of RCA 100% 4/23--> Staged PCI to LAD-Diag with ? Diag dissection as complication & Type IVa MI;    * HLD (hyperlipidemia) -- on high dose statin   * Essential hypertension (was initially borderline hypotensive.- with BB, BP now somewhat  decreased -- held ACE-I (prefer BB for now given re-infarction & concern for potential arrhythmia. - he is feeling better and less dizzy fatigue off of the ACE inhibitor. Will hold that until the outpatient setting.  * Chronic Allergic Rhinitis -- no Pseudophed --on Flonase & Claritin.      He is doing well ambulating in hall without any problems. He has no angina. Stable for d/c home today with early TOC followup.  Will repeat BMET to make sure creatinine stable.  Quintella Reichert, MD  10/07/2015  10:54 AM

## 2015-10-07 NOTE — Care Management Note (Signed)
Case Management Note  Patient Details  Name: Merrilee SeashoreKeith Benton MRN: [id number] Date of Birth: [birth year]  Subjective/Objective:                   NSTEMI  Action/Plan: CM spoke with patient and his wife. Referred to his employer to discuss the process for completing forms for disability. Patient provided with the telephone number for [facility] to locate a PCP. Also referred to his health plan Customer Service number for a list of in-network PCPs'.   Expected Discharge Date:  10/02/15               Expected Discharge Plan:  Home/Self Care  In-House Referral:     Discharge planning Services  CM Consult  Post Acute Care Choice:    Choice offered to:     DME Arranged:    DME Agency:     HH Arranged:    HH Agency:     Status of Service:  Completed, signed off  Medicare Important Message Given:    Date Medicare IM Given:    Medicare IM give by:    Date Additional Medicare IM Given:    Additional Medicare Important Message give by:     If discussed at Long Length of Stay Meetings, dates discussed:    Additional Comments:  Antony HasteBennett, Creg Gilmer Harris, RN 10/07/2015, 1:30 PM

## 2015-10-07 NOTE — Discharge Summary (Signed)
Discharge Summary    Patient ID: Timothy Charles,  MRN: [id number], DOB/AGE: 1970-10-01 45 y.o.  Admit date: 09/30/2015 Discharge date: 10/07/2015  Primary Care Provider: No PCP Per Patient Primary Cardiologist: Dr Katrinka Blazing  Discharge Diagnoses    Principal Problem:   NSTEMI (non-ST elevated myocardial infarction) [facility]) Active Problems:   HLD (hyperlipidemia)   Coronary artery disease involving native coronary artery with unstable angina pectoris [facility])   Essential hypertension   Post-infarction angina (HCC)   Allergies No Known Allergies  Diagnostic Studies/Procedures    CATH: 04/23 1. Prox LAD lesion, 80% stenosed. 2. Ost Ramus to Ramus lesion, 70% stenosed. 3. Prox RCA to Mid RCA lesion, 100% stenosed. Post intervention, there is a 0% residual stenosis.  Non-ST elevation myocardial infarction due to total occlusion of the large right coronary artery which is well collateralized from the left coronary system.  Successful angioplasty, catheter-based thrombectomy, and Promus Premier 4.0 x 28 mm DES stenting from 100% to 0% with TIMI grade 3 flow. Postdilatation balloon diameter of 4.5 mm.  80% mid LAD Medina 101 bifurcation stenosis. 70% proximal stenosis of the ramus intermedius.  Widely patent circumflex  Inferior wall akinesis, presumed stunned. LVEF 40-45% Recommendations:  Aspirin and Brilinta  Staged LAD PCI on 10/03/2015 . Mendel Ryder to perform .  Aggressive risk factor modification .  CATH: 04/25 4. Ost Ramus to Ramus lesion, 70% stenosed. 5. Prox LAD lesion, 80% stenosed. Post intervention with a 3.5 x 20 mm Promus Premier DES, there is a 0% residual stenosis. The lesion was previously treated with a stent (unknown type).  LAD diagonal PCI of a Medina 1, 0, 1 bifurcation lesion complicated by acute occlusion of a moderate to large diagonal branch.  Successful LAD stenting with 90% stenosis reduced to 0%.  Re-wire and angioplasty of diagonal from 100% to  less than 50% with extensive spiral dissection noted. TIMI grade 3 flow was reestablished at the end of the case.  Persistent chest discomfort following the procedure secondary to diagonal dissection. Recommendation:  IV nitroglycerin  Analgesia with morphine  Cycle cardiac markers  Discussed with patient and family the genesis of the procedural complication  Follow clinically  Stent was not performed because of the vessel size and extensive nature of the spiral dissection. I felt there was increased risk of death thrombosis and potential technical issues that could threaten the LAD. _____________   History of Present Illness     45 yo male w/ hx HTN, FH CAD, spinal stenosis went to [name] ED 04/22 with chest pain, troponin elevated, transferred to [facility].  Hospital Course     Consultants: none   He was taken to the cath lab on 04/23, results above. He had PCI to the RCA with staged PCI to the LAD planned. His EF was 40-45%.    He was started on ASA, Brilinta, Lipitor 80 mg, and low-dose metoprolol was added as his BP would allow. He was tried on low-dose lisinopril, but his BP would not tolerate both ACE/BB so the BB was preferred.   He was taken back to the cath lab on 04/25. He had successful intervention on the LAD, but spiral dissection of a diagonal branch was seen. The images were reviewed carefully by Dr Katrinka Blazing and med rx was recommended. He was managed with pain medications and nitrates. The nitrates were changed to Imdur once his symptoms were controlled.   On 04/29, he was seen by Dr Mayford Knife and all data were reviewed.  His VS were stable. His pain had resolved. He was ambulating without difficulty. No further inpatient workup was indicated and he was considered stable for discharge, to follow up as an outpatient. _____________  Discharge Vitals Blood pressure 126/62, pulse 68, temperature 97.8 F (36.6 C), temperature source Oral, resp. rate 18, height  (1.803 m),  weight 214 lb 3.2 oz (97.16 kg), SpO2 100 %.  Filed Weights   10/03/15 1445 10/06/15 0305 10/07/15 0552  Weight: 217 lb 6 oz (98.6 kg) 212 lb (96.163 kg) 214 lb 3.2 oz (97.16 kg)    Labs & Radiologic Studies    CBC Lab Results  Component Value Date   WBC 10.8* 10/04/2015   HGB 13.3 10/04/2015   HCT 39.4 10/04/2015   MCV 88.1 10/04/2015   PLT 188 10/04/2015   Basic Metabolic Panel    Component Value Date/Time   NA 138 10/04/2015 0222   K 3.8 10/04/2015 0222   CL 101 10/04/2015 0222   CO2 26 10/04/2015 0222   GLUCOSE 113* 10/04/2015 0222   BUN 6 10/04/2015 0222   CREATININE 0.95 10/04/2015 0222   CREATININE 0.99 01/28/2014 1220   CALCIUM 8.9 10/04/2015 0222   GFRNONAA >60 10/04/2015 0222   GFRNONAA >89 11/23/2013 1152   GFRAA >60 10/04/2015 0222   GFRAA >89 11/23/2013 1152   Liver Function Tests Lab Results  Component Value Date   ALT 22 01/28/2014   AST 18 01/28/2014   ALKPHOS 63 01/28/2014   BILITOT 0.6 01/28/2014    Cardiac Enzymes TROPONIN I  Date Value Ref Range Status  10/03/2015 4.64* <0.031 ng/mL Final    Comment:           POSSIBLE MYOCARDIAL ISCHEMIA. SERIAL TESTING RECOMMENDED. CRITICAL VALUE NOTED.  VALUE IS CONSISTENT WITH PREVIOUSLY REPORTED AND CALLED VALUE.   10/03/2015 5.02* <0.031 ng/mL Final    Comment:           POSSIBLE MYOCARDIAL ISCHEMIA. SERIAL TESTING RECOMMENDED. CRITICAL VALUE NOTED.  VALUE IS CONSISTENT WITH PREVIOUSLY REPORTED AND CALLED VALUE.   10/03/2015 3.81* <0.031 ng/mL Final    Comment:           POSSIBLE MYOCARDIAL ISCHEMIA. SERIAL TESTING RECOMMENDED. CRITICAL VALUE NOTED.  VALUE IS CONSISTENT WITH PREVIOUSLY REPORTED AND CALLED VALUE.    Hemoglobin A1C Lab Results  Component Value Date   HGBA1C 5.8* 09/30/2015   Fasting Lipid Panel Lab Results  Component Value Date   CHOL 176 10/01/2015   HDL 24* 10/01/2015   LDLCALC 91 10/01/2015   TRIG 304* 10/01/2015   CHOLHDL 7.3 10/01/2015    Thyroid  Function Tests Lab Results  Component Value Date   TSH 3.191 09/30/2015  _____________  Dg Chest 2 View  09/30/2015  CLINICAL DATA:  LEFT-sided chest pain. EXAM: CHEST  2 VIEW COMPARISON:  05/04/2015 FINDINGS: Normal mediastinum and cardiac silhouette. Normal pulmonary vasculature. No evidence of effusion, infiltrate, or pneumothorax. No acute bony abnormality. IMPRESSION: No acute cardiopulmonary process. Electronically Signed   By: Genevive Bi M.D.   On: 09/30/2015 17:49   Disposition   Pt is being discharged home today in good condition.  Follow-up Plans & Appointments     Discharge Instructions    AMB Referral to Cardiac Rehabilitation - Phase II    Complete by:  As directed   Diagnosis:  NSTEMI     Amb Referral to Cardiac Rehabilitation    Complete by:  As directed   Diagnosis:   Coronary Stents NSTEMI  Diet - low sodium heart healthy    Complete by:  As directed      Increase activity slowly    Complete by:  As directed            Discharge Medications   Current Discharge Medication List    START taking these medications   Details  atorvastatin (LIPITOR) 80 MG tablet Take 1 tablet (80 mg total) by mouth daily at 6 PM. Qty: 30 tablet, Refills: 11    isosorbide mononitrate (IMDUR) 30 MG 24 hr tablet Take 1 tablet (30 mg total) by mouth daily. Qty: 30 tablet, Refills: 11    metoprolol tartrate (LOPRESSOR) 25 MG tablet Take 1 tablet (25 mg total) by mouth 2 (two) times daily. Qty: 60 tablet, Refills: 11    nitroGLYCERIN (NITROSTAT) 0.4 MG SL tablet Place 1 tablet (0.4 mg total) under the tongue every 5 (five) minutes as needed for chest pain. Qty: 25 tablet, Refills: 12    pantoprazole (PROTONIX) 40 MG tablet Take 1 tablet (40 mg total) by mouth daily at 6 (six) AM. Qty: 30 tablet, Refills: 3    ticagrelor (BRILINTA) 90 MG TABS tablet Take 1 tablet (90 mg total) by mouth 2 (two) times daily. Qty: 60 tablet, Refills: 11      CONTINUE these  medications which have NOT CHANGED   Details  HYDROcodone-acetaminophen (NORCO) 5-325 MG per tablet Take 1 tablet by mouth every 6 (six) hours as needed. Qty: 30 tablet, Refills: 0    levocetirizine (XYZAL) 5 MG tablet Take 5 mg by mouth every evening.    tiZANidine (ZANAFLEX) 2 MG tablet Take 2 mg by mouth at bedtime.    benzonatate (TESSALON) 100 MG capsule Take 1 capsule (100 mg total) by mouth every 8 (eight) hours. Qty: 21 capsule, Refills: 0      STOP taking these medications     azithromycin (ZITHROMAX Z-PAK) 250 MG tablet      predniSONE (DELTASONE) 10 MG tablet      pseudoephedrine (SUDAFED 12 HOUR) 120 MG 12 hr tablet          Aspirin prescribed at discharge?  Yes High Intensity Statin Prescribed? (Lipitor 40-80mg  or Crestor 20-40mg ): Yes Beta Blocker Prescribed? Yes For EF <40%, was ACEI/ARB Prescribed? No: n/a ADP Receptor Inhibitor Prescribed? (i.e. Plavix etc.-Includes Medically Managed Patients): Yes For EF <40%, Aldosterone Inhibitor Prescribed? No: n/a Was EF assessed during THIS hospitalization? Yes Was Cardiac Rehab II ordered? (Included Medically managed Patients): Yes   Outstanding Labs/Studies   none  Duration of Discharge Encounter   Greater than 30 minutes including physician time.  Melida QuitterSigned, Ritha Sampedro NP 10/07/2015, 11:45 AM

## 2015-10-10 ENCOUNTER — Telehealth: Payer: Self-pay | Admitting: Interventional Cardiology

## 2015-10-10 NOTE — Telephone Encounter (Signed)
New Message  TCM per Staff messages   Azalee CourseHao Meng, PA  P Cv [address] Scheduling            Seen by Dr. Katrinka BlazingSmith, came in with NSTEMI, need 7 day TCM followup. Please call and arrange. Note Theodore DemarkRhonda Barrett may have already sent message on this patient.      Appt sch with Theodore Demarkhonda Barrett on 10/13/2015 at 2:00p

## 2015-10-10 NOTE — Telephone Encounter (Signed)
[facility].  Patient understands to follow up with Theodore Demarkhonda Barrett, PA on Friday 10/13/15 at 2:00 pm (advised to arrive at 1:45 pm) [facility].  Patient understands discharge instructions? Yes Patient understands medications and regiment? Yes Patient understands to bring all medications to this visit? Yes

## 2015-10-13 ENCOUNTER — Ambulatory Visit (INDEPENDENT_AMBULATORY_CARE_PROVIDER_SITE_OTHER): Payer: BLUE CROSS/BLUE SHIELD | Admitting: Physician Assistant

## 2015-10-13 ENCOUNTER — Encounter: Payer: Self-pay | Admitting: Physician Assistant

## 2015-10-13 VITALS — BP 112/70 | HR 64 | Ht 71.0 in | Wt 216.6 lb

## 2015-10-13 DIAGNOSIS — I237 Postinfarction angina: Secondary | ICD-10-CM

## 2015-10-13 DIAGNOSIS — I25110 Atherosclerotic heart disease of native coronary artery with unstable angina pectoris: Secondary | ICD-10-CM

## 2015-10-13 DIAGNOSIS — I214 Non-ST elevation (NSTEMI) myocardial infarction: Secondary | ICD-10-CM

## 2015-10-13 MED ORDER — ISOSORBIDE MONONITRATE ER 30 MG PO TB24: 15.0000 mg | ORAL_TABLET | Freq: Every day | ORAL | Status: AC

## 2015-10-13 NOTE — Patient Instructions (Addendum)
Medication Instructions:  Your physician has recommended you make the following change in your medication:  1.  DECREASE the Imdur to 30 mg taking 1/2 tablet daily (LET US KNOW HOW YOUR HEADACHE DOES) 2.  DRINK Caffeinated Coke with your Brilinta in the a.m. And if it helps with the Shortness of breath, drink coke with your evening dose   Labwork: Your physician recommends that you return for a FASTING lipid profile & liver profile in 3 MONTHS AT YOUR APPOINTMENT   Testing/Procedures: None ordered  Follow-Up: Your physician recommends that you schedule a follow-up appointment in: 3 MONTHS WITH DR. Katrinka BlazingSMITH   Any Other Special Instructions Will Be Listed Below (If Applicable).     If you need a refill on your cardiac medications before your next appointment, please call your pharmacy.

## 2015-10-13 NOTE — Progress Notes (Signed)
Cardiology Office Note   Date:  10/17/2015   ID:  Timothy Charles, DOB [birth year], MRN [id number]  PCP:  Paulino Rily, MD  Cardiologist:  Dr Vicente Serene, PA-C    History of Present Illness: Timothy Charles is a 45 y.o. male with a history of HTN, FH CAD, spinal stenosis.  D/c 04/29 after admit for NSTEMI, s/p RCA and LAD DES. EF 40-45%  Timothy Charles presents for post hospital follow-up  Because of the occlusion of the diagonal branch that happened during PCI of his LAD, he had significant chest pain prior to leaving the hospital. Since discharge, he has been gradually improving. He is increasing his activity, playing with his children, and feels like he is doing very well. He has not had any significant chest pain since discharge. He has not had lower extremity edema, denies orthopnea or PND.  He has had some trouble sleeping, and is taking naps in the evening. He then has trouble going to sleep at night. He is compliant with his medications. He is having headaches. He is having some shortness of breath and wonders if it is from the [place].  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  He and his wife had made significant dietary changes and feel like they are doing much better and eating a heart healthy diet.   Past Medical History  Diagnosis Date  . Allergy   . Anxiety   . Essential hypertension   . Spinal stenosis at L4-L5 level   . NSTEMI (non-ST elevated myocardial infarction) (HCC) 09/30/2015    DES to the RCA and staged DES to the LAD, occlusion of diagonal branch during procedure    Past Surgical History  Procedure Laterality Date  . Cardiac catheterization N/A 10/01/2015    Procedure: Left Heart Cath and Coronary Angiography;  Surgeon: Lyn Records, MD;  Location: [facility] INVASIVE CV LAB;  Service: Cardiovascular;  Laterality: N/A;  . Cardiac catheterization N/A 10/01/2015    Procedure:  Coronary Stent Intervention;  Surgeon: Lyn Records, MD;  Location: [facility] INVASIVE CV LAB;  Service: Cardiovascular;  Laterality: N/A;  . Cardiac catheterization N/A 10/03/2015    Procedure: Coronary Stent Intervention;  Surgeon: Lyn Records, MD;  Location: [facility] INVASIVE CV LAB;  Service: Cardiovascular;  Laterality: N/A;    Current Outpatient Prescriptions  Medication Sig Dispense Refill  . aspirin 81 MG chewable tablet Chew 1 tablet (81 mg total) by mouth daily.    Marland Kitchen atorvastatin (LIPITOR) 80 MG tablet Take 1 tablet (80 mg total) by mouth daily at 6 PM. 30 tablet 11  . benzonatate (TESSALON) 100 MG capsule Take 1 capsule (100 mg total) by mouth every 8 (eight) hours. 21 capsule 0  . HYDROcodone-acetaminophen (NORCO) 5-325 MG per tablet Take 1 tablet by mouth every 6 (six) hours as needed. 30 tablet 0  . isosorbide mononitrate (IMDUR) 30 MG 24 hr tablet Take 1 tablet (30 mg total) by mouth daily. 30 tablet 11  . levocetirizine (XYZAL) 5 MG tablet Take 5 mg by mouth every evening.    . metoprolol tartrate (LOPRESSOR) 25 MG tablet Take 1 tablet (25 mg total) by mouth 2 (two) times daily. 60 tablet 11  . nitroGLYCERIN (NITROSTAT) 0.4 MG SL tablet Place 1 tablet (0.4 mg total) under the tongue every 5 (five) minutes as needed for chest pain. 25 tablet 12  . pantoprazole (PROTONIX) 40 MG tablet Take 1  tablet (40 mg total) by mouth daily at 6 (six) AM. 30 tablet 3  . ticagrelor (BRILINTA) 90 MG TABS tablet Take 1 tablet (90 mg total) by mouth 2 (two) times daily. 60 tablet 11  . tiZANidine (ZANAFLEX) 2 MG tablet Take 2 mg by mouth at bedtime.     No current facility-administered medications for this visit.    Allergies:   Review of patient's allergies indicates no known allergies.    Social History:  The patient  reports that he quit smoking about 2 weeks ago. He does not have any smokeless tobacco history on file. He reports that he drinks alcohol. He reports that he does not use illicit drugs.    Family History:  The patient's family history includes Cancer in his mother; Diabetes in his brother, brother, and mother; Heart attack in his brother, maternal uncle, and mother; Heart disease in his brother and mother; Hyperlipidemia in his brother, brother, and mother; Hypertension in his brother and mother; Stroke in his brother and mother.    ROS:  Please see the history of present illness. All other systems are reviewed and negative.    PHYSICAL EXAM: VS:  BP 112/70 mmHg  Pulse 64  Ht  (1.803 m)  Wt 216 lb 9.6 oz (98.249 kg)  BMI 30.22 kg/m2 , BMI Body mass index is 30.22 kg/(m^2). GEN: Well nourished, well developed, male in no acute distress HEENT: normal for age  Neck: no JVD, no carotid bruit, no masses Cardiac: RRR; no murmur, no rubs, or gallops Respiratory:  clear to auscultation bilaterally, normal work of breathing GI: soft, nontender, nondistended, + BS MS: no deformity or atrophy; no edema; distal pulses are 2+ in all 4 extremities  Skin: warm and dry, no rash Neuro:  Strength and sensation are intact Psych: euthymic mood, full affect   EKG:  EKG is ordered today. The ekg ordered today demonstrates sinus rhythm, incomplete right bundle branch block, no significant change from 10/03/2015  Cath: 10/01/2015 1. Prox LAD lesion, 80% stenosed. 2. Ost Ramus to Ramus lesion, 70% stenosed. 3. Prox RCA to Mid RCA lesion, 100% stenosed. Post intervention Promus Prem Mr 4.0x28, there is a 0% residual stenosis.  Non-ST elevation myocardial infarction due to total occlusion of the large right coronary artery which is well collateralized from the left coronary system.  Successful angioplasty, catheter-based thrombectomy, and DES stenting from 100% to 0% with TIMI grade 3 flow. Postdilatation balloon diameter of 4.5 mm.  80% mid LAD [place] 101 bifurcation stenosis. 70% proximal stenosis of the ramus intermedius.  Widely patent circumflex  Inferior wall akinesis,  presumed stunned. LVEF 40-45% Recommendations:  Aspirin and Brilinta  Staged LAD PCI on 10/03/2015 . Mendel Ryder to perform .  Aggressive risk factor modification .  10/03/2015 4. Ost Ramus to Ramus lesion, 70% stenosed. 5. Prox LAD lesion, 80% stenosed. Post intervention Promus Prem Mr 3.5x20 , there is a 0% residual stenosis. The lesion was previously treated with a stent (unknown type).  LAD diagonal PCI of a [place] 1, 0, 1 bifurcation lesion complicated by acute occlusion of a moderate to large diagonal branch.  Successful LAD stenting with 90% stenosis reduced to 0%.  Re-wire and angioplasty of diagonal from 100% to less than 50% with extensive spiral dissection noted. TIMI grade 3 flow was reestablished at the end of the case.  Persistent chest discomfort following the procedure secondary to diagonal dissection.  Recent Labs: 09/30/2015: TSH 3.191 10/04/2015: Hemoglobin 13.3; Platelets 188 10/07/2015:  BUN 11; Creatinine, Ser 0.96; Potassium 4.3; Sodium 137    Lipid Panel    Component Value Date/Time   CHOL 176 10/01/2015 0419   TRIG 304* 10/01/2015 0419   HDL 24* 10/01/2015 0419   CHOLHDL 7.3 10/01/2015 0419   VLDL 61* 10/01/2015 0419   LDLCALC 91 10/01/2015 0419     Wt Readings from Last 3 Encounters:  10/13/15 216 lb 9.6 oz (98.249 kg)  10/07/15 214 lb 3.2 oz (97.16 kg)  03/05/14 210 lb 6.4 oz (95.437 kg)     Other studies Reviewed: Additional studies/ records that were reviewed today include: Hospital records and testing.  ASSESSMENT AND PLAN:  1.  Follow-up after non-STEMI: Staged PCI of the LAD was complicated by occlusion of the large diagonal branch. He is recovering well from this and is compliant with his medications. He is having some shortness of breath that may be from the Brilinta. He is encouraged to try drinking Coca-Cola with it to see if it helps. He can try Coca-Cola with the evening dose as well as the a.m. dose if this is effective for him. If  this does not work, he will need to be changed to Plavix but we would like to keep him on Brilinta for at least 3 months. Because the headache, we will decrease the isosorbide.  2. Poor sleep hygiene/insomnia: He is having some stressors because he is afraid he will not be able to go back to work. However, he is also taking a nap in the evening and then unable to get to sleep at night. He has not found much help from Benadryl or melatonin. He is encouraged to try to stay and not take a nap to trying get back on her normal sleep/wake cycle. If this does not work, follow-up with primary care.  3. Hyperlipidemia: He is tolerating the Lipitor well, recheck in 3 months with a hepatic function profile.  4. Ischemic cardiomyopathy: His EF was 40-45 percent at cath, but was normal at echo.   Current medicines are reviewed at length with the patient today.  The patient does not have concerns regarding medicines.  The following changes have been made:  Decrease Imdur, drink Coke with the Brilinta  Labs/ tests ordered today include:   Orders Placed This Encounter  Procedures  . Lipid panel  . Hepatic function panel  . EKG 12-Lead     Disposition:   FU with Dr. Katrinka BlazingSmith  Signed, Theodore DemarkBarrett, Rhonda, PA-C  10/17/2015 4:51 PM    [facility] Phone: [phone]([phone]; Fax: [phone]  This note was written with the assistance of speech recognition software. Please excuse any transcriptional errors.

## 2015-10-17 ENCOUNTER — Encounter: Payer: Self-pay | Admitting: Physician Assistant

## 2015-10-18 ENCOUNTER — Telehealth: Payer: Self-pay | Admitting: Physician Assistant

## 2015-10-18 NOTE — Telephone Encounter (Signed)
Spoke with Biomedical scientistpatient's safety director for patient's work company.  Per his release letter, he can go back to work 5/15 with no restrictions Patient's work Aeronautical engineersafety director wanted to make sure that R. 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

## 2015-10-18 NOTE — Telephone Encounter (Signed)
New message      Calling to talk to Timothy DemarkRhonda Charles about patient's return to work status

## 2015-10-19 NOTE — Telephone Encounter (Signed)
Called and spoke to pt to confirm that he feels well enough to go back to work. He is doing well, does not think he will have a problem climbing the ladder when needed and had previously tolerated the heat ok.   Called the work number and spoke to them. Advised he was doing well and I had no reason to think he was not OK to return to work.   Official at work agreed that there was no way to guarantee he would do well, but when I explained that his risk might actually be lower because we had treated his CAD, he agreed with that also.  OK to return to work on 05/15.  Leanna BattlesBarrett, Gaile Allmon, PA-C 10/19/2015 4:56 PM Beeper [phone]

## 2015-10-25 ENCOUNTER — Encounter (HOSPITAL_COMMUNITY)
Admission: EM | Disposition: A | Discharge status: Home / Self Care | Payer: Self-pay | Source: Home / Self Care | Attending: Emergency Medicine

## 2015-10-25 ENCOUNTER — Telehealth: Payer: Self-pay | Admitting: Interventional Cardiology

## 2015-10-25 ENCOUNTER — Emergency Department (HOSPITAL_COMMUNITY): Payer: BLUE CROSS/BLUE SHIELD

## 2015-10-25 ENCOUNTER — Observation Stay (HOSPITAL_COMMUNITY)
Admission: EM | Admit: 2015-10-25 | Discharge: 2015-10-26 | Disposition: A | Discharge status: Home / Self Care | Payer: BLUE CROSS/BLUE SHIELD | Attending: Cardiovascular Disease | Admitting: Cardiovascular Disease

## 2015-10-25 ENCOUNTER — Encounter (HOSPITAL_COMMUNITY): Payer: Self-pay

## 2015-10-25 DIAGNOSIS — I10 Essential (primary) hypertension: Secondary | ICD-10-CM | POA: Diagnosis present

## 2015-10-25 DIAGNOSIS — Z7951 Long term (current) use of inhaled steroids: Secondary | ICD-10-CM | POA: Insufficient documentation

## 2015-10-25 DIAGNOSIS — R0789 Other chest pain: Principal | ICD-10-CM | POA: Insufficient documentation

## 2015-10-25 DIAGNOSIS — I237 Postinfarction angina: Secondary | ICD-10-CM

## 2015-10-25 DIAGNOSIS — Z87891 Personal history of nicotine dependence: Secondary | ICD-10-CM | POA: Diagnosis not present

## 2015-10-25 DIAGNOSIS — Z79899 Other long term (current) drug therapy: Secondary | ICD-10-CM | POA: Diagnosis not present

## 2015-10-25 DIAGNOSIS — Z7902 Long term (current) use of antithrombotics/antiplatelets: Secondary | ICD-10-CM | POA: Diagnosis not present

## 2015-10-25 DIAGNOSIS — I252 Old myocardial infarction: Secondary | ICD-10-CM | POA: Insufficient documentation

## 2015-10-25 DIAGNOSIS — E785 Hyperlipidemia, unspecified: Secondary | ICD-10-CM | POA: Diagnosis present

## 2015-10-25 DIAGNOSIS — R7303 Prediabetes: Secondary | ICD-10-CM | POA: Diagnosis not present

## 2015-10-25 DIAGNOSIS — I25110 Atherosclerotic heart disease of native coronary artery with unstable angina pectoris: Secondary | ICD-10-CM | POA: Diagnosis not present

## 2015-10-25 DIAGNOSIS — Z955 Presence of coronary angioplasty implant and graft: Secondary | ICD-10-CM | POA: Diagnosis not present

## 2015-10-25 DIAGNOSIS — R0600 Dyspnea, unspecified: Secondary | ICD-10-CM

## 2015-10-25 DIAGNOSIS — R079 Chest pain, unspecified: Secondary | ICD-10-CM | POA: Diagnosis not present

## 2015-10-25 DIAGNOSIS — I2510 Atherosclerotic heart disease of native coronary artery without angina pectoris: Secondary | ICD-10-CM | POA: Diagnosis present

## 2015-10-25 DIAGNOSIS — Z7982 Long term (current) use of aspirin: Secondary | ICD-10-CM | POA: Insufficient documentation

## 2015-10-25 HISTORY — DX: Bee allergy status: Z91.030

## 2015-10-25 HISTORY — DX: Low back pain, unspecified: M54.50

## 2015-10-25 HISTORY — DX: Low back pain: M54.5

## 2015-10-25 HISTORY — PX: CARDIAC CATHETERIZATION: SHX172

## 2015-10-25 HISTORY — DX: Other chronic pain: G89.29

## 2015-10-25 HISTORY — DX: Atherosclerotic heart disease of native coronary artery without angina pectoris: I25.10

## 2015-10-25 HISTORY — DX: Other seasonal allergic rhinitis: J30.2

## 2015-10-25 LAB — BASIC METABOLIC PANEL
ANION GAP: 14 (ref 5–15)
BUN: 16 mg/dL (ref 6–20)
CALCIUM: 9.6 mg/dL (ref 8.9–10.3)
CO2: 26 mmol/L (ref 22–32)
Chloride: 100 mmol/L — ABNORMAL LOW (ref 101–111)
Creatinine, Ser: 1.12 mg/dL (ref 0.61–1.24)
Glucose, Bld: 106 mg/dL — ABNORMAL HIGH (ref 65–99)
Potassium: 4.1 mmol/L (ref 3.5–5.1)
Sodium: 140 mmol/L (ref 135–145)

## 2015-10-25 LAB — CBC WITH DIFFERENTIAL/PLATELET
BASOS ABS: 0 10*3/uL (ref 0.0–0.1)
BASOS PCT: 0 %
Eosinophils Absolute: 0.1 10*3/uL (ref 0.0–0.7)
Eosinophils Relative: 2 %
HEMATOCRIT: 40.9 % (ref 39.0–52.0)
Hemoglobin: 13.7 g/dL (ref 13.0–17.0)
Lymphocytes Relative: 19 %
Lymphs Abs: 1.4 10*3/uL (ref 0.7–4.0)
MCH: 29.8 pg (ref 26.0–34.0)
MCHC: 33.5 g/dL (ref 30.0–36.0)
MCV: 89.1 fL (ref 78.0–100.0)
MONO ABS: 0.8 10*3/uL (ref 0.1–1.0)
Monocytes Relative: 11 %
NEUTROS ABS: 5 10*3/uL (ref 1.7–7.7)
NEUTROS PCT: 68 %
Platelets: 221 10*3/uL (ref 150–400)
RBC: 4.59 MIL/uL (ref 4.22–5.81)
RDW: 13.2 % (ref 11.5–15.5)
WBC: 7.3 10*3/uL (ref 4.0–10.5)

## 2015-10-25 LAB — TROPONIN I: Troponin I: <0.03 ng/mL (ref <0.031)

## 2015-10-25 LAB — PROTIME-INR
INR: 1.07 (ref 0.00–1.49)
INR: 1.18 (ref 0.00–1.49)
PROTHROMBIN TIME: 15.2 s (ref 11.6–15.2)
Prothrombin Time: 14.1 seconds (ref 11.6–15.2)

## 2015-10-25 LAB — BRAIN NATRIURETIC PEPTIDE: B Natriuretic Peptide: 57.3 pg/mL (ref 0.0–100.0)

## 2015-10-25 LAB — I-STAT TROPONIN, ED: TROPONIN I, POC: 0.01 ng/mL (ref 0.00–0.08)

## 2015-10-25 LAB — SEDIMENTATION RATE: SED RATE: 39 mm/h — AB (ref 0–16)

## 2015-10-25 LAB — HEPARIN LEVEL (UNFRACTIONATED): HEPARIN UNFRACTIONATED: 0.27 [IU]/mL — AB (ref 0.30–0.70)

## 2015-10-25 LAB — D-DIMER, QUANTITATIVE: D-Dimer, Quant: <0.27 ug/mL-FEU (ref 0.00–0.50)

## 2015-10-25 SURGERY — LEFT HEART CATH AND CORS/GRAFTS ANGIOGRAPHY

## 2015-10-25 MED ORDER — SODIUM CHLORIDE 0.9% FLUSH
3.0000 mL | Freq: Two times a day (BID) | INTRAVENOUS | Status: DC
  Administered 2015-10-25: 22:00:00 3 mL via INTRAVENOUS

## 2015-10-25 MED ORDER — ALPRAZOLAM 0.25 MG PO TABS
0.2500 mg | ORAL_TABLET | Freq: Once | ORAL | Status: AC
  Administered 2015-10-25: 0.25 mg via ORAL
  Filled 2015-10-25: qty 1

## 2015-10-25 MED ORDER — ACETAMINOPHEN 325 MG PO TABS: 650.0000 mg | ORAL_TABLET | ORAL | Status: DC | PRN

## 2015-10-25 MED ORDER — MIDAZOLAM HCL 2 MG/2ML IJ SOLN
INTRAMUSCULAR | Status: AC
  Filled 2015-10-25: qty 2

## 2015-10-25 MED ORDER — ASPIRIN 81 MG PO CHEW
81.0000 mg | CHEWABLE_TABLET | Freq: Every day | ORAL | Status: DC
  Administered 2015-10-26: 09:00:00 81 mg via ORAL
  Filled 2015-10-25: qty 1

## 2015-10-25 MED ORDER — NITROGLYCERIN 0.4 MG SL SUBL
0.4000 mg | SUBLINGUAL_TABLET | SUBLINGUAL | Status: DC | PRN
  Administered 2015-10-25: 0.4 mg via SUBLINGUAL
  Filled 2015-10-25: qty 1

## 2015-10-25 MED ORDER — MIDAZOLAM HCL 2 MG/2ML IJ SOLN
INTRAMUSCULAR | Status: DC | PRN
  Administered 2015-10-25: 2 mg via INTRAVENOUS

## 2015-10-25 MED ORDER — ASPIRIN 300 MG RE SUPP
300.0000 mg | RECTAL | Status: DC
  Filled 2015-10-25: qty 1

## 2015-10-25 MED ORDER — TIZANIDINE HCL 2 MG PO TABS
2.0000 mg | ORAL_TABLET | Freq: Every day | ORAL | Status: DC
  Administered 2015-10-25: 2 mg via ORAL
  Filled 2015-10-25: qty 1

## 2015-10-25 MED ORDER — SODIUM CHLORIDE 0.9 % IV SOLN: 250.0000 mL | INTRAVENOUS | Status: DC | PRN

## 2015-10-25 MED ORDER — PANTOPRAZOLE SODIUM 40 MG PO TBEC
40.0000 mg | DELAYED_RELEASE_TABLET | Freq: Every day | ORAL | Status: DC
  Administered 2015-10-26: 40 mg via ORAL
  Filled 2015-10-25: qty 1

## 2015-10-25 MED ORDER — LEVOCETIRIZINE DIHYDROCHLORIDE 5 MG PO TABS: 5.0000 mg | ORAL_TABLET | Freq: Every evening | ORAL | Status: DC

## 2015-10-25 MED ORDER — SODIUM CHLORIDE 0.9% FLUSH: 3.0000 mL | INTRAVENOUS | Status: DC | PRN

## 2015-10-25 MED ORDER — FENTANYL CITRATE (PF) 100 MCG/2ML IJ SOLN
INTRAMUSCULAR | Status: DC | PRN
  Administered 2015-10-25: 25 ug via INTRAVENOUS

## 2015-10-25 MED ORDER — ONDANSETRON HCL 4 MG/2ML IJ SOLN: 4.0000 mg | Freq: Four times a day (QID) | INTRAMUSCULAR | Status: DC | PRN

## 2015-10-25 MED ORDER — SODIUM CHLORIDE 0.9% FLUSH: 3.0000 mL | Freq: Two times a day (BID) | INTRAVENOUS | Status: DC

## 2015-10-25 MED ORDER — ASPIRIN EC 81 MG PO TBEC
81.0000 mg | DELAYED_RELEASE_TABLET | Freq: Every day | ORAL | Status: DC
  Filled 2015-10-25: qty 1

## 2015-10-25 MED ORDER — SODIUM CHLORIDE 0.9 % WEIGHT BASED INFUSION: 3.0000 mL/kg/h | INTRAVENOUS | Status: AC

## 2015-10-25 MED ORDER — ASPIRIN 81 MG PO CHEW
81.0000 mg | CHEWABLE_TABLET | ORAL | Status: DC
  Filled 2015-10-25: qty 1

## 2015-10-25 MED ORDER — MORPHINE SULFATE (PF) 4 MG/ML IV SOLN
4.0000 mg | Freq: Once | INTRAVENOUS | Status: AC
  Administered 2015-10-25: 4 mg via INTRAVENOUS
  Filled 2015-10-25: qty 1

## 2015-10-25 MED ORDER — HEPARIN SODIUM (PORCINE) 1000 UNIT/ML IJ SOLN
INTRAMUSCULAR | Status: DC | PRN
  Administered 2015-10-25: 5000 [IU] via INTRAVENOUS

## 2015-10-25 MED ORDER — IOPAMIDOL (ISOVUE-370) INJECTION 76%
INTRAVENOUS | Status: AC
  Filled 2015-10-25: qty 100

## 2015-10-25 MED ORDER — METOPROLOL TARTRATE 25 MG PO TABS
25.0000 mg | ORAL_TABLET | Freq: Two times a day (BID) | ORAL | Status: DC
  Administered 2015-10-25 – 2015-10-26 (×2): 25 mg via ORAL
  Filled 2015-10-25 (×2): qty 1

## 2015-10-25 MED ORDER — SODIUM CHLORIDE 0.9 % WEIGHT BASED INFUSION: 1.0000 mL/kg/h | INTRAVENOUS | Status: DC

## 2015-10-25 MED ORDER — HEPARIN BOLUS VIA INFUSION
4000.0000 [IU] | Freq: Once | INTRAVENOUS | Status: AC
  Administered 2015-10-25: 4000 [IU] via INTRAVENOUS
  Filled 2015-10-25: qty 4000

## 2015-10-25 MED ORDER — TICAGRELOR 90 MG PO TABS
90.0000 mg | ORAL_TABLET | Freq: Two times a day (BID) | ORAL | Status: DC
  Administered 2015-10-25 – 2015-10-26 (×2): 90 mg via ORAL
  Filled 2015-10-25 (×2): qty 1

## 2015-10-25 MED ORDER — IOPAMIDOL (ISOVUE-370) INJECTION 76%
INTRAVENOUS | Status: DC | PRN
  Administered 2015-10-25: 82 mL via INTRA_ARTERIAL

## 2015-10-25 MED ORDER — HEPARIN (PORCINE) IN NACL 100-0.45 UNIT/ML-% IJ SOLN
1150.0000 [IU]/h | INTRAMUSCULAR | Status: DC
  Administered 2015-10-25: 1150 [IU]/h via INTRAVENOUS
  Filled 2015-10-25: qty 250

## 2015-10-25 MED ORDER — FLUTICASONE PROPIONATE 50 MCG/ACT NA SUSP
1.0000 | Freq: Every day | NASAL | Status: DC
  Administered 2015-10-26: 09:00:00 1 via NASAL
  Filled 2015-10-25: qty 16

## 2015-10-25 MED ORDER — HYDROCODONE-ACETAMINOPHEN 5-325 MG PO TABS
1.0000 | ORAL_TABLET | Freq: Four times a day (QID) | ORAL | Status: DC | PRN
  Administered 2015-10-25 – 2015-10-26 (×2): 1 via ORAL
  Filled 2015-10-25 (×2): qty 1

## 2015-10-25 MED ORDER — VERAPAMIL HCL 2.5 MG/ML IV SOLN
INTRAVENOUS | Status: AC
  Filled 2015-10-25: qty 2

## 2015-10-25 MED ORDER — LORATADINE 10 MG PO TABS
10.0000 mg | ORAL_TABLET | Freq: Every day | ORAL | Status: DC
  Administered 2015-10-25: 19:00:00 10 mg via ORAL
  Filled 2015-10-25: qty 1

## 2015-10-25 MED ORDER — FENTANYL CITRATE (PF) 100 MCG/2ML IJ SOLN
INTRAMUSCULAR | Status: AC
  Filled 2015-10-25: qty 2

## 2015-10-25 MED ORDER — HEPARIN (PORCINE) IN NACL 2-0.9 UNIT/ML-% IJ SOLN
INTRAMUSCULAR | Status: AC
  Filled 2015-10-25: qty 1000

## 2015-10-25 MED ORDER — ATORVASTATIN CALCIUM 80 MG PO TABS
80.0000 mg | ORAL_TABLET | Freq: Every day | ORAL | Status: DC
  Administered 2015-10-25: 19:00:00 80 mg via ORAL
  Filled 2015-10-25: qty 1

## 2015-10-25 MED ORDER — VERAPAMIL HCL 2.5 MG/ML IV SOLN
INTRAVENOUS | Status: DC | PRN
  Administered 2015-10-25: 10 mL via INTRA_ARTERIAL

## 2015-10-25 MED ORDER — HEPARIN SODIUM (PORCINE) 1000 UNIT/ML IJ SOLN
INTRAMUSCULAR | Status: AC
  Filled 2015-10-25: qty 1

## 2015-10-25 MED ORDER — ASPIRIN 81 MG PO CHEW: 324.0000 mg | CHEWABLE_TABLET | ORAL | Status: DC

## 2015-10-25 MED ORDER — NITROGLYCERIN 0.4 MG SL SUBL: 0.4000 mg | SUBLINGUAL_TABLET | SUBLINGUAL | Status: DC | PRN

## 2015-10-25 MED ORDER — ISOSORBIDE MONONITRATE ER 30 MG PO TB24
15.0000 mg | ORAL_TABLET | Freq: Every day | ORAL | Status: DC
  Administered 2015-10-26: 15 mg via ORAL
  Filled 2015-10-25: qty 1

## 2015-10-25 MED ORDER — HEPARIN (PORCINE) IN NACL 100-0.45 UNIT/ML-% IJ SOLN: 1350.0000 [IU]/h | INTRAMUSCULAR | Status: DC

## 2015-10-25 MED ORDER — LIDOCAINE HCL (PF) 1 % IJ SOLN
INTRAMUSCULAR | Status: AC
  Filled 2015-10-25: qty 30

## 2015-10-25 MED ORDER — LIDOCAINE HCL (PF) 1 % IJ SOLN
INTRAMUSCULAR | Status: DC | PRN
  Administered 2015-10-25: 2 mL

## 2015-10-25 SURGICAL SUPPLY — 10 items
CATH INFINITI 5 FR JL3.5 (CATHETERS) ×3 IMPLANT
CATH INFINITI JR4 5F (CATHETERS) ×3 IMPLANT
DEVICE RAD COMP TR BAND LRG (VASCULAR PRODUCTS) ×3 IMPLANT
GLIDESHEATH SLEND A-KIT 6F 22G (SHEATH) ×3 IMPLANT
GLIDESHEATH SLEND SS 6F .021 (SHEATH) IMPLANT
KIT HEART LEFT (KITS) ×3 IMPLANT
PACK CARDIAC CATHETERIZATION (CUSTOM PROCEDURE TRAY) ×3 IMPLANT
TRANSDUCER W/STOPCOCK (MISCELLANEOUS) ×3 IMPLANT
TUBING CIL FLEX 10 FLL-RA (TUBING) ×3 IMPLANT
WIRE SAFE-T 1.5MM-J .035X260CM (WIRE) ×3 IMPLANT

## 2015-10-25 NOTE — Interval H&P Note (Signed)
Cath Lab Visit (complete for each Cath Lab visit)  Clinical Evaluation Leading to the Procedure:   ACS: No.  Non-ACS:    Anginal Classification: CCS III  Anti-ischemic medical therapy: Minimal Therapy (1 class of medications)  Non-Invasive Test Results: No non-invasive testing performed  Prior CABG: No previous CABG      History and Physical Interval Note:  10/25/2015 4:31 PM  Merrilee SeashoreKeith Dower  has presented today for surgery, with the diagnosis of elevator trop  The various methods of treatment have been discussed with the patient and family. After consideration of risks, benefits and other options for treatment, the patient has consented to  Procedure(s): Left Heart Cath and Cors/Grafts Angiography (N/A) as a surgical intervention .  The patient's history has been reviewed, patient examined, no change in status, stable for surgery.  I have reviewed the patient's chart and labs.  Questions were answered to the patient's satisfaction.     Lyn RecordsHenry W Smith III

## 2015-10-25 NOTE — Discharge Planning (Signed)
ED Case Management Note  Subjective/Objective:  45 yo patient presents the emergency department complaining of chest discomfort which began this morning at approximate 7 AM. He is status post staged treatment of coronary artery disease with his last stent placed on 10/03/2015./ Home with spouse.   Expected Discharge Date:          10/26/15       Action/Plan:  Follow for disposition needs. / Admit to hospital for observation.

## 2015-10-25 NOTE — ED Notes (Signed)
Pt presents with onset of mid-sternal chest pain that began while at work.  +shortness of breath, pain radiates into L arm.  Pt reports vomiting yesterday but denies nausea today.  Stents x 2 placed 3 weeks ago.  Pt took NTG x 1 prior to arrival with some relief.

## 2015-10-25 NOTE — H&P (Signed)
Patient ID: Timothy Charles MRN: [id number], DOB/AGE: February 03, 1971   Admit date: 10/25/2015   Primary Physician: Andria Frames, MD Primary Cardiologist: Dr. Tamala Julian  Reason for admission: chest pain Pt. Profile:  Timothy Charles is a 45 y.o. male with a history of HLD, HTN, CAD s/p DES to RCA and staged DES to LAD c/b spiral dissection of diag (09/2015) who presented to the [facility] ER today with chest pain.   He was admitted 4/22-4/29/17 with NSTEMI and possibly evolving STEMI. He was taken to the cath lab on 04/23 which showed 80% LAD, 70% ramus, 100% RCA. He underwent PCI/DES to the RCA with staged PCI to the LAD planned. His EF was 40-45%. He was started on ASA, Brilinta, Lipitor 80 mg, and low-dose metoprolol was added as his BP would allow. He was tried on low-dose lisinopril, but his BP would not tolerate both ACE/BB so the BB was continued alone. 2D ECHO on 10/02/15 showed EF 55-60% with no RWMAs. He was taken back to the cath lab on 04/25. He had successful intervention on the LAD, but spiral dissection of a diagonal branch was seen. The images were reviewed carefully by Dr Tamala Julian and med rx was recommended. His troponin did bump post procedure. He was managed with pain medications and nitrates. He was converted to Imdur once his symptoms were controlled.   He was seen by Rosaria Ferries PA-C in the office on 10/13/15. He complained of SOB felt to be related to Brilinta and HA and imdur was decreased. He was not sleeping well.   He called our office today complaining of symptom similar to NSTEMI. He was told to come to the ER for further evaluation. Troponin neg x1. CXR clear. ECG with no acute ST or TW changes.   He says he never felt better after his stenting. He continued to have extreme fatigue and sob. Drinking coca-cola with Brilinta has not helped. He was chest pain free until today while on the car ride to work. He had chest pain with radiation to his left arm. No nausea although he did  have some vomiting yesterday. He continues to have extreme SOB worse with exertion. Chest pain is not worse with supine position or better leaning forward. He took a SL NTG with some relief for about 20 minutes but then chest pain returned. No LE edema, orthopnea or PND. No dizziness or syncope. 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     Problem List  Past Medical History  Diagnosis Date  . Allergy   . Anxiety   . Essential hypertension   . Spinal stenosis at L4-L5 level   . NSTEMI (non-ST elevated myocardial infarction) ([place]) 09/30/2015    DES to the RCA and staged DES to the LAD, occlusion of diagonal branch during procedure    Past Surgical History  Procedure Laterality Date  . Cardiac catheterization N/A 10/01/2015    Procedure: Left Heart Cath and Coronary Angiography;  Surgeon: Belva Crome, MD;  Location: [place] [facility];  Service: Cardiovascular;  Laterality: N/A;  . Cardiac catheterization N/A 10/01/2015    Procedure: Coronary Stent Intervention;  Surgeon: Belva Crome, MD;  Location: [place] [facility];  Service: Cardiovascular;  Laterality: N/A;  . Cardiac catheterization N/A 10/03/2015    Procedure: Coronary Stent Intervention;  Surgeon: Belva Crome, MD;  Location: [place] [facility];  Service: Cardiovascular;  Laterality: N/A;  Allergies  Allergies  Allergen Reactions  . Bee Venom Shortness Of Breath     Home Medications  Prior to Admission medications   Medication Sig Start Date End Date Taking? Authorizing Provider  aspirin 81 MG chewable tablet Chew 1 tablet (81 mg total) by mouth daily. 10/07/15  Yes Almyra Deforest, PA  atorvastatin (LIPITOR) 80 MG tablet Take 1 tablet (80 mg total) by mouth daily at 6 PM. 10/07/15  Yes Rhonda G Barrett, PA-C  fluticasone (FLONASE) 50 MCG/ACT nasal spray Place 1 spray into both nostrils daily.   Yes Historical Provider, MD  HYDROcodone-acetaminophen (NORCO/VICODIN)  5-325 MG tablet Take 1 tablet by mouth every 6 (six) hours as needed for moderate pain or severe pain.   Yes Historical Provider, MD  isosorbide mononitrate (IMDUR) 30 MG 24 hr tablet Take 0.5 tablets (15 mg total) by mouth daily. 10/13/15  Yes Rhonda G Barrett, PA-C  levocetirizine (XYZAL) 5 MG tablet Take 5 mg by mouth every evening.   Yes Historical Provider, MD  metoprolol tartrate (LOPRESSOR) 25 MG tablet Take 1 tablet (25 mg total) by mouth 2 (two) times daily. 10/07/15  Yes Rhonda G Barrett, PA-C  nitroGLYCERIN (NITROSTAT) 0.4 MG SL tablet Place 1 tablet (0.4 mg total) under the tongue every 5 (five) minutes as needed for chest pain. 10/07/15  Yes Rhonda G Barrett, PA-C  pantoprazole (PROTONIX) 40 MG tablet Take 1 tablet (40 mg total) by mouth daily at 6 (six) AM. 10/07/15  Yes Rhonda G Barrett, PA-C  ticagrelor (BRILINTA) 90 MG TABS tablet Take 1 tablet (90 mg total) by mouth 2 (two) times daily. 10/07/15  Yes Rhonda G Barrett, PA-C  tiZANidine (ZANAFLEX) 2 MG tablet Take 2 mg by mouth at bedtime. 06/29/15  Yes Historical Provider, MD    Family History  Family History  Problem Relation Age of Onset  . Cancer Mother   . Diabetes Mother   . Heart disease Mother   . Hyperlipidemia Mother   . Hypertension Mother   . Stroke Mother   . Diabetes Brother   . Heart disease Brother   . Hyperlipidemia Brother   . Stroke Brother   . Diabetes Brother   . Hyperlipidemia Brother   . Hypertension Brother   . Heart attack Brother   . Heart attack Mother   . Heart attack Maternal Uncle    Family Status  Relation Status Death Age  . Mother Alive   . Father Alive   . Sister Alive   . Brother Alive   . Brother Alive      Social History  Social History   Social History  . Marital Status: Married    Spouse Name: N/A  . Number of Children: N/A  . Years of Education: N/A   Occupational History  . Not on file.   Social History Main Topics  . Smoking status: Former Smoker    Quit date:  09/29/2015  . Smokeless tobacco: Not on file  . Alcohol Use: 0.0 oz/week    0 Standard drinks or equivalent per week     Comment: occasional  . Drug Use: No  . Sexual Activity: Not on file   Other Topics Concern  . Not on file   Social History Narrative     All other systems reviewed and are otherwise negative except as noted above.  Physical Exam  Blood pressure 112/78, pulse 66, temperature 97.8 F (36.6 C), temperature source Oral, resp. rate 17, height '5\' 11"'  (1.803 m), weight  218 lb (98.884 kg), SpO2 97 %.  General: Pleasant, NAD Psych: Normal affect. Neuro: Alert and oriented X 3. Moves all extremities spontaneously. HEENT: Normal  Neck: Supple without bruits or JVD. Lungs:  Resp regular and unlabored, CTA. Heart: RRR no s3, s4, or murmurs. Abdomen: Soft, non-tender, non-distended, BS + x 4.  Extremities: No clubbing, cyanosis or edema. DP/PT/Radials 2+ and equal bilaterally.  Labs   Recent Labs  10/25/15 0915  TROPONINI <0.03   Lab Results  Component Value Date   WBC 7.3 10/25/2015   HGB 13.7 10/25/2015   HCT 40.9 10/25/2015   MCV 89.1 10/25/2015   PLT 221 10/25/2015    Recent Labs Lab 10/25/15 0915  NA 140  K 4.1  CL 100*  CO2 26  BUN 16  CREATININE 1.12  CALCIUM 9.6  GLUCOSE 106*   Lab Results  Component Value Date   CHOL 176 10/01/2015   HDL 24* 10/01/2015   LDLCALC 91 10/01/2015   TRIG 304* 10/01/2015   No results found for: DDIMER   Radiology/Studies  Dg Chest 2 View  09/30/2015  CLINICAL DATA:  LEFT-sided chest pain. EXAM: CHEST  2 VIEW COMPARISON:  05/04/2015 FINDINGS: Normal mediastinum and cardiac silhouette. Normal pulmonary vasculature. No evidence of effusion, infiltrate, or pneumothorax. No acute bony abnormality. IMPRESSION: No acute cardiopulmonary process. Electronically Signed   By: Suzy Bouchard M.D.   On: 09/30/2015 17:49   Dg Chest Portable 1 View  10/25/2015  CLINICAL DATA:  Chest pain beginning this morning. No  known injury. History of coronary artery stent placement. Initial encounter. EXAM: PORTABLE CHEST 1 VIEW COMPARISON:  PA and lateral chest 09/30/2015 and 05/04/2015. FINDINGS: The lungs are clear. Heart size is normal. No pneumothorax or pleural effusion. No focal bony abnormality. IMPRESSION: Negative chest. Electronically Signed   By: Inge Rise M.D.   On: 10/25/2015 09:47    CATH: 04/23 1. Prox LAD lesion, 80% stenosed. 2. Ost Ramus to Ramus lesion, 70% stenosed. 3. Prox RCA to Mid RCA lesion, 100% stenosed. Post intervention, there is a 0% residual stenosis.  Non-ST elevation myocardial infarction due to total occlusion of the large right coronary artery which is well collateralized from the left coronary system.  Successful angioplasty, catheter-based thrombectomy, and Promus Premier 4.0 x 28 mm DES stenting from 100% to 0% with TIMI grade 3 flow. Postdilatation balloon diameter of 4.5 mm.  80% mid LAD [place] 101 bifurcation stenosis. 70% proximal stenosis of the ramus intermedius.  Widely patent circumflex  Inferior wall akinesis, presumed stunned. LVEF 40-45% Recommendations:  Aspirin and Brilinta  Staged LAD PCI on 10/03/2015 . Linard Millers to perform .  Aggressive risk factor modification .  CATH: 04/25 4. Ost Ramus to Ramus lesion, 70% stenosed. 5. Prox LAD lesion, 80% stenosed. Post intervention with a 3.5 x 20 mm Promus Premier DES, there is a 0% residual stenosis. The lesion was previously treated with a stent (unknown type).  LAD diagonal PCI of a Med[place] 1, 0, 1 bifurcation lesion complicated by acute occlusion of a moderate to large diagonal branch.  Successful LAD stenting with 90% stenosis reduced to 0%.  Re-wire and angioplasty of diagonal from 100% to less than 50% with extensive spiral dissection noted. TIMI grade 3 flow was reestablished at the end of the case.  Persistent chest discomfort following the procedure secondary to diagonal  dissection. Recommendation:  IV nitroglycerin  Analgesia with morphine  Cycle cardiac markers  Discussed with patient and family the genesis of  the procedural complication  Follow clinically  Stent was not performed because of the vessel size and extensive nature of the spiral dissection. I felt there was increased risk of death thrombosis and potential technical issues that could threaten the LAD. _____________    ECG  HR 51 Sinus rhythm Anterior q waves  ASSESSMENT AND PLAN  Kairo Laubacher is a 45 y.o. male with a history of HLD, HTN, CAD s/p DES to RCA and staged DES to LAD c/b spiral dissection of diag (09/2015) who presented to the [facility] ER today with chest pain.   Chest pain worrisome for [place]: symptoms similar to previous with recent NSTEMI. Nitrate responsive but chest pain returned. Currently with 5/10 chest pain. Will put on cath board for cath today. Will also check BNP, D dimer and ESR.   I have reviewed the risks, indications, and alternatives to cardiac catheterization and possible angioplasty/stenting with the patient. Risks include but are not limited to bleeding, infection, vascular injury, stroke, myocardial infection, arrhythmia, kidney injury, radiation-related injury in the case of prolonged fluoroscopy use, emergency cardiac surgery, and death. The patient understands the risks of serious complication is low (<3%).    HTN: BP well controlled  HLD: continue statin   Pre-diabetes: HgA1c 5.8. Diet and exercise recommended    Signed, Angelena Form, PA-C 10/25/2015, 11:31 AM  Pager [phone]  Patient examined chart reviewed. Symptoms seem disproportionate to exam LUngs clear CXR normal. No acute ECG changes and negative troponin. 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 Favor repeat right and left heart cath to assess patency of stents.  Check D dimer ESR and BNP.   Discussed with Dr Tamala Julian who did his previous procedures and he can do latter this afternoon.  Lab called orders written Discussed with nurse in ER need for right antecubital iv for right heart cath   [facility]

## 2015-10-25 NOTE — Telephone Encounter (Signed)
SPOKE WITH PT   AND  PT  IS COMPLAINING  WITH  CHEST PRESSURE, SOB,   AND  ARM  PAIN  .  SIMILAR TO PREVIOUS  MI. STATES  HAS  WORKED A LOT  OVER  THE LAST  COUPLE  OF  DAYS   AND  WAS  TOLD  AFTER  PROCEDURE WAS  COMPLETE  WOULD  FEEL  A LOT  BETTER .PER  PT  HAS  NOT FELT BETTER HAS A LOT OF  FATIGUE. HAS NOT TAKEN ANY NTG   ENCOURAGED  TO   TRY AND  TAKE  AND  SEE  IF  GETS  ANY RELIEF. DISCUSSED WITH  DR Graciela HusbandsKLEIN  AND  IT  IS  RECOMMENDED  PT  NEEDS  TO  GO  TO ER  FOR EVAL AND  TX.PT CALLED  AND  IS  NOTIFIED OF  RECOMMENDATIONS ENCOURAGED  PT TO  HAVE  SOMEONE  DRIVE  HIM TO  ER   PT VERBALIZED UNDERSTANDING./CY

## 2015-10-25 NOTE — ED Provider Notes (Signed)
CSN: [id number]     Arrival date & time 10/25/15  0911 History   First MD Initiated Contact with Patient 10/25/15 [phone]     Chief Complaint  Patient presents with  . Chest Pain     The history is provided by the patient, medical records and the spouse.   Patient presents the emergency department complaining of chest discomfort which began this morning at approximate 7 AM.  He is status post staged treatment of coronary artery disease with his last stent placed on 10/03/2015.  He initially underwent intervention on his right coronary artery.  During stage treatment of his LAD he ended up with an acute occlusion and dissection of a large diagonal.  This was angioplastied with resultant grade 3 flow.  He states he had been doing well however through the weekend he began feeling extremely fatigued and this morning awoke with substernal chest pressure with radiation to the left shoulder.  He contacted the cardiology office who recommended he come to the ER.  Patient reports ongoing discomfort and pain at this time.  Denies abdominal pain.  No orthopnea.  No lower extremity edema.   Past Medical History  Diagnosis Date  . Allergy   . Anxiety   . Essential hypertension   . Spinal stenosis at L4-L5 level   . NSTEMI (non-ST elevated myocardial infarction) ([facility]) 09/30/2015    DES to the RCA and staged DES to the LAD, occlusion of diagonal branch during procedure   Past Surgical History  Procedure Laterality Date  . Cardiac catheterization N/A 10/01/2015    Procedure: Left Heart Cath and Coronary Angiography;  Surgeon: Lyn Records, MD;  Location: [facility] INVAS[facility];  Service: Cardiovascular;  Laterality: N/A;  . Cardiac catheterization N/A 10/01/2015    Procedure: Coronary Stent Intervention;  Surgeon: Lyn Records, MD;  Location: [facility] INVASIVE CV LAB;  Service: Cardiovascular;  Laterality: N/A;  . Cardiac catheterization N/A 10/03/2015    Procedure: Coronary Stent Intervention;  Surgeon: Lyn Records, MD;  Location: [facility] INVASIVE CV LAB;  Service: Cardiovascular;  Laterality: N/A;   Family History  Problem Relation Age of Onset  . Cancer Mother   . Diabetes Mother   . Heart disease Mother   . Hyperlipidemia Mother   . Hypertension Mother   . Stroke Mother   . Diabetes Brother   . Heart disease Brother   . Hyperlipidemia Brother   . Stroke Brother   . Diabetes Brother   . Hyperlipidemia Brother   . Hypertension Brother   . Heart attack Brother   . Heart attack Mother   . Heart attack Maternal Uncle    Social History  Substance Use Topics  . Smoking status: Former Smoker    Quit date: 09/29/2015  . Smokeless tobacco: None  . Alcohol Use: 0.0 oz/week    0 Standard drinks or equivalent per week     Comment: occasional    Review of Systems  All other systems reviewed and are negative.     Allergies  Review of patient's allergies indicates no known allergies.  Home Medications   Prior to Admission medications   Medication Sig Start Date End Date Taking? Authorizing Provider  aspirin 81 MG chewable tablet Chew 1 tablet (81 mg total) by mouth daily. 10/07/15   Azalee Course, PA  atorvastatin (LIPITOR) 80 MG tablet Take 1 tablet (80 mg total) by mouth daily at 6 PM. 10/07/15   Joline Salt Barrett, PA-C  fluticasone (FLONASE) 50  MCG/ACT nasal spray Place 1 spray into both nostrils daily.    Historical Provider, MD  HYDROcodone-acetaminophen (NORCO/VICODIN) 5-325 MG tablet Take 1 tablet by mouth every 6 (six) hours as needed for moderate pain or severe pain.    Historical Provider, MD  isosorbide mononitrate (IMDUR) 30 MG 24 hr tablet Take 0.5 tablets (15 mg total) by mouth daily. 10/13/15   Rhonda G Barrett, PA-C  levocetirizine (XYZAL) 5 MG tablet Take 5 mg by mouth every evening.    Historical Provider, MD  metoprolol tartrate (LOPRESSOR) 25 MG tablet Take 1 tablet (25 mg total) by mouth 2 (two) times daily. 10/07/15   Rhonda G Barrett, PA-C  nitroGLYCERIN (NITROSTAT) 0.4 MG SL  tablet Place 1 tablet (0.4 mg total) under the tongue every 5 (five) minutes as needed for chest pain. 10/07/15   Rhonda G Barrett, PA-C  pantoprazole (PROTONIX) 40 MG tablet Take 1 tablet (40 mg total) by mouth daily at 6 (six) AM. 10/07/15   Joline Salt Barrett, PA-C  ticagrelor (BRILINTA) 90 MG TABS tablet Take 1 tablet (90 mg total) by mouth 2 (two) times daily. 10/07/15   Rhonda G Barrett, PA-C  tiZANidine (ZANAFLEX) 2 MG tablet Take 2 mg by mouth at bedtime. 06/29/15   Historical Provider, MD   BP 112/83 mmHg  Pulse 58  Temp(Src) 97.8 F (36.6 C) (Oral)  Resp 17  Ht 5\' 11"  (1.803 m)  Wt 218 lb (98.884 kg)  BMI 30.42 kg/m2  SpO2 97% Physical Exam  Constitutional: He is oriented to person, place, and time. He appears well-developed and well-nourished.  HENT:  Head: Normocephalic and atraumatic.  Eyes: EOM are normal.  Neck: Normal range of motion.  Cardiovascular: Normal rate, regular rhythm, normal heart sounds and intact distal pulses.   Pulmonary/Chest: Effort normal and breath sounds normal. No respiratory distress.  Abdominal: Soft. He exhibits no distension. There is no tenderness.  Musculoskeletal: Normal range of motion.  Neurological: He is alert and oriented to person, place, and time.  Skin: Skin is warm and dry.  Psychiatric: He has a normal mood and affect. Judgment normal.  Nursing note and vitals reviewed.   ED Course  Procedures (including critical care time)  ++++++++++++++++++++++++++++++++++++++++++++++++++++++++++  CRITICAL CARE Performed by: Lyanne Co Total critical care time: 33 minutes Critical care time was exclusive of separately billable procedures and treating other patients. Critical care was necessary to treat or prevent imminent or life-threatening deterioration. Critical care was time spent personally by me on the following activities: development of treatment plan with patient and/or surrogate as well as nursing, discussions with consultants,  evaluation of patient's response to treatment, examination of patient, obtaining history from patient or surrogate, ordering and performing treatments and interventions, ordering and review of laboratory studies, ordering and review of radiographic studies, pulse oximetry and re-evaluation of patient's condition.   ++++++++++++++++++++++++++++++++++++++++++++++++++++++++++++++   Labs Review Labs Reviewed  BASIC METABOLIC PANEL - Abnormal; Notable for the following:    Chloride 100 (*)    Glucose, Bld 106 (*)    All other components within normal limits  CBC WITH DIFFERENTIAL/PLATELET  TROPONIN I  PROTIME-INR  HEPARIN LEVEL (UNFRACTIONATED)  I-STAT TROPOININ, ED    Imaging Review Dg Chest Portable 1 View  10/25/2015  CLINICAL DATA:  Chest pain beginning this morning. No known injury. History of coronary artery stent placement. Initial encounter. EXAM: PORTABLE CHEST 1 VIEW COMPARISON:  PA and lateral chest 09/30/2015 and 05/04/2015. FINDINGS: The lungs are clear. Heart size is normal.  No pneumothorax or pleural effusion. No focal bony abnormality. IMPRESSION: Negative chest. Electronically Signed   By: Drusilla Kannerhomas  Dalessio M.D.   On: 10/25/2015 09:47   I have personally reviewed and evaluated these images and lab results as part of my medical decision-making.   EKG Interpretation   Date/Time:  Wednesday Oct 25 2015 09:18:45 EDT Ventricular Rate:  51 PR Interval:  166 QRS Duration: 96 QT Interval:  414 QTC Calculation: 381 R Axis:   46 Text Interpretation:  Sinus rhythm Anterior infarct, old No significant  change was found Confirmed by Austan Nicholl  MD, Gale Hulse ([id number]) on 10/25/2015  9:26:53 AM      MDM   Final diagnoses:  Chest pain, unspecified chest pain type    Initial troponin is negative.  This may represent Dressler syndrome versus unstable angina. concerning Enough for acute coronary syndrome that the patient will be initiated on heparin at this time.  EKG with nonspecific  changes.  Initial troponin is negative.  Patient be started on a heparin drip.  Cardiology consultation.  Patient will likely benefit from Cath Lab evaluation    Azalia BilisKevin Rue Valladares, MD 10/30/15 [phone]

## 2015-10-25 NOTE — Progress Notes (Signed)
ANTICOAGULATION CONSULT NOTE   Pharmacy Consult for heparin Indication: chest pain/ACS  Allergies  Allergen Reactions  . Bee Venom Shortness Of Breath    Patient Measurements: Height: 5\' 11"  (180.3 cm) Weight: 218 lb (98.884 kg) IBW/kg (Calculated) : 75.3 Heparin Dosing Weight: 95.6  Vital Signs: Temp: 97.8 F (36.6 C) (05/17 0918) Temp Source: Oral (05/17 0918) BP: 124/76 mmHg (05/17 1656) Pulse Rate: 0 (05/17 1700)  Labs:  Recent Labs  10/25/15 0915 10/25/15 1605  HGB 13.7  --   HCT 40.9  --   PLT 221  --   LABPROT 14.1  --   INR 1.07  --   HEPARINUNFRC  --  0.27*  CREATININE 1.12  --   TROPONINI <0.03  --     Estimated Creatinine Clearance: 101.9 mL/min (by C-G formula based on Cr of 1.12).   Medical History: Past Medical History  Diagnosis Date  . Allergy   . Anxiety   . Essential hypertension   . Spinal stenosis at L4-L5 level   . NSTEMI (non-ST elevated myocardial infarction) ([facility]) 09/30/2015    DES to the RCA and staged DES to the LAD, occlusion of diagonal branch during procedure    Medications:  Scheduled:    Assessment: 45 yo man admitted 10/25/2015 for midsternal CP and SOB with pain radiating to L arm. Of note, admitted for NSTEMI s/p PCI x 2 end of last month.  PMH CAD s/p PCI to RCA and LAD, HTN, spinal stenosis  CBC wnl, no noted bleeding. No anticoagulation prior to admission. Level prior to cath came back subtherapeutic at 0.27. Plan is to restart heparin 8 hours post sheath removal. Will increase slightly from previous rate to attempt to get within goal heparin level.  16:55:00 10/25/15 Sheath Removal    Goal of Therapy:  Heparin level 0.3-0.7 units/ml Monitor platelets by anticoagulation protocol: Yes   Plan:  Restart heparin infusion 8 hours post sheath removal at 1350 units/hr Check anti-Xa level in 6 hours and daily while on heparin Continue to monitor H&H and platelets  Monitor s/sx bleeding   Thank you for allowing us  to participate in this patients care. Signe Coltonya C Kirat Mezquita, PharmD Pager: [phone]  10/25/2015,5:25 PM

## 2015-10-25 NOTE — ED Notes (Signed)
Call to 3W to attempt to give report, staff not able to take

## 2015-10-25 NOTE — ED Notes (Signed)
To Cath Lab at this time 

## 2015-10-25 NOTE — Telephone Encounter (Signed)
New Message  Pt c/o of Chest Pain: STAT if CP now or developed within 24 hours  1. Are you having CP right now? Yes   2. Are you experiencing any other symptoms (ex. SOB, nausea, vomiting, sweating)? SOB  3. How long have you been experiencing CP? This morning was the worse  4. Is your CP continuous or coming and going? Steady pressure  5. Have you taken Nitroglycerin? no ?

## 2015-10-25 NOTE — Progress Notes (Addendum)
ANTICOAGULATION CONSULT NOTE - Initial Consult  Pharmacy Consult for heparin Indication: chest pain/ACS  No Known Allergies  Patient Measurements: Height: 5\' 11"  (180.3 cm) Weight: 218 lb (98.884 kg) IBW/kg (Calculated) : 75.3 Heparin Dosing Weight: 95.6  Vital Signs: Temp: 97.8 F (36.6 C) (05/17 0918) Temp Source: Oral (05/17 0918) BP: 139/94 mmHg (05/17 0918) Pulse Rate: 57 (05/17 0918)  Labs: No results for input(s): HGB, HCT, PLT, APTT, LABPROT, INR, HEPARINUNFRC, HEPRLOWMOCWT, CREATININE, CKTOTAL, CKMB, TROPONINI in the last 72 hours.  Estimated Creatinine Clearance: 118.9 mL/min (by C-G formula based on Cr of 0.96).   Medical History: Past Medical History  Diagnosis Date  . Allergy   . Anxiety   . Essential hypertension   . Spinal stenosis at L4-L5 level   . NSTEMI (non-ST elevated myocardial infarction) (HCC) 09/30/2015    DES to the RCA and staged DES to the LAD, occlusion of diagonal branch during procedure    Medications:  Scheduled:    Assessment: 45 yo man admitted 10/25/2015 for midsternal CP and SOB with pain radiating to L arm. Of note, admitted for NSTEMI s/p PCI x 2 end of last month.  PMH CAD s/p PCI to RCA and LAD, HTN, spinal stenosis  CBC wnl, no noted bleeding. No anticoagulation prior to admission. Will start bolus and heparin gtt.  Goal of Therapy:  Heparin level 0.3-0.7 units/ml Monitor platelets by anticoagulation protocol: Yes   Plan:  Give 4000 units bolus x 1 Start heparin infusion at 1150 units/hr Check anti-Xa level in 6 hours and daily while on heparin Continue to monitor H&H and platelets  Monitor s/sx bleeding   Hillery AldoElizabeth Nidya Bouyer, Pharm.D., BCPS PGY2 Cardiology Pharmacy Resident Pager: [phone]  10/25/2015,9:42 AM

## 2015-10-25 NOTE — ED Notes (Signed)
Chest xray taken at bedside. 

## 2015-10-25 NOTE — ED Notes (Signed)
Attempted to call report, receiving nurse st's cath lab reports they will be ready in approx 45 mins.  Charge nurse agreed to let pt stay in ED until cath lab is ready for pt.

## 2015-10-26 ENCOUNTER — Observation Stay (HOSPITAL_BASED_OUTPATIENT_CLINIC_OR_DEPARTMENT_OTHER): Payer: BLUE CROSS/BLUE SHIELD

## 2015-10-26 ENCOUNTER — Other Ambulatory Visit (HOSPITAL_COMMUNITY): Payer: BLUE CROSS/BLUE SHIELD

## 2015-10-26 ENCOUNTER — Encounter (HOSPITAL_COMMUNITY): Payer: Self-pay | Admitting: Interventional Cardiology

## 2015-10-26 DIAGNOSIS — R0789 Other chest pain: Secondary | ICD-10-CM | POA: Diagnosis not present

## 2015-10-26 DIAGNOSIS — I2510 Atherosclerotic heart disease of native coronary artery without angina pectoris: Secondary | ICD-10-CM | POA: Diagnosis present

## 2015-10-26 DIAGNOSIS — I25110 Atherosclerotic heart disease of native coronary artery with unstable angina pectoris: Secondary | ICD-10-CM | POA: Diagnosis not present

## 2015-10-26 DIAGNOSIS — I10 Essential (primary) hypertension: Secondary | ICD-10-CM | POA: Diagnosis not present

## 2015-10-26 DIAGNOSIS — R0600 Dyspnea, unspecified: Secondary | ICD-10-CM | POA: Diagnosis not present

## 2015-10-26 DIAGNOSIS — R079 Chest pain, unspecified: Secondary | ICD-10-CM | POA: Diagnosis present

## 2015-10-26 DIAGNOSIS — I237 Postinfarction angina: Secondary | ICD-10-CM | POA: Diagnosis not present

## 2015-10-26 LAB — CBC
HCT: 38.5 % — ABNORMAL LOW (ref 39.0–52.0)
HEMOGLOBIN: 12.8 g/dL — AB (ref 13.0–17.0)
MCH: 29.4 pg (ref 26.0–34.0)
MCHC: 33.2 g/dL (ref 30.0–36.0)
MCV: 88.5 fL (ref 78.0–100.0)
Platelets: 177 10*3/uL (ref 150–400)
RBC: 4.35 MIL/uL (ref 4.22–5.81)
RDW: 13.2 % (ref 11.5–15.5)
WBC: 6.2 10*3/uL (ref 4.0–10.5)

## 2015-10-26 LAB — BASIC METABOLIC PANEL
ANION GAP: 12 (ref 5–15)
BUN: 13 mg/dL (ref 6–20)
CO2: 26 mmol/L (ref 22–32)
Calcium: 8.8 mg/dL — ABNORMAL LOW (ref 8.9–10.3)
Chloride: 102 mmol/L (ref 101–111)
Creatinine, Ser: 1.08 mg/dL (ref 0.61–1.24)
GFR calc Af Amer: >60 mL/min (ref >60)
Glucose, Bld: 110 mg/dL — ABNORMAL HIGH (ref 65–99)
POTASSIUM: 3.9 mmol/L (ref 3.5–5.1)
SODIUM: 140 mmol/L (ref 135–145)

## 2015-10-26 LAB — LIPID PANEL
CHOL/HDL RATIO: 6.1 ratio
CHOLESTEROL: 128 mg/dL (ref 0–200)
HDL: 21 mg/dL — ABNORMAL LOW (ref >40)
LDL Cholesterol: UNDETERMINED mg/dL (ref 0–99)
Triglycerides: 420 mg/dL — ABNORMAL HIGH (ref <150)
VLDL: UNDETERMINED mg/dL (ref 0–40)

## 2015-10-26 LAB — TROPONIN I: Troponin I: <0.03 ng/mL (ref <0.031)

## 2015-10-26 LAB — PROTIME-INR
INR: 1.04 (ref 0.00–1.49)
PROTHROMBIN TIME: 13.8 s (ref 11.6–15.2)

## 2015-10-26 LAB — ECHOCARDIOGRAM LIMITED
HEIGHTINCHES: 71 in
WEIGHTICAEL: 3456.81 [oz_av]

## 2015-10-26 MED FILL — Heparin Sodium (Porcine) 2 Unit/ML in Sodium Chloride 0.9%: INTRAMUSCULAR | Qty: 500 | Status: AC

## 2015-10-26 NOTE — Progress Notes (Signed)
Patient ID: Timothy SeashoreKeith Steffy, male   DOB: 1970-12-22, 45 y.o.   MRN: [id number]    Subjective:  Denies SSCP, palpitations or Dyspnea Sleepy  Objective:  Filed Vitals:   10/25/15 2041 10/25/15 2100 10/25/15 2143 10/26/15 0430  BP: 131/84 116/59 116/59 118/82  Pulse: 53 59 56 59  Temp: 97.3 F (36.3 C)   97.4 F (36.3 C)  TempSrc: Oral   Oral  Resp: 11 12  12   Height:      Weight:    98 kg (216 lb 0.8 oz)  SpO2: 99% 97%  99%    Intake/Output from previous day:  Intake/Output Summary (Last 24 hours) at 10/26/15 [id number] Last data filed at 10/26/15 0432  Gross per 24 hour  Intake      0 ml  Output    800 ml  Net   -800 ml    Physical Exam: Affect appropriate Healthy:  appears stated age HEENT: normal Neck supple with no adenopathy JVP normal no bruits no thyromegaly Lungs clear with no wheezing and good diaphragmatic motion Heart:  S1/S2 no murmur, no rub, gallop or click PMI normal Abdomen: benighn, BS positve, no tenderness, no AAA no bruit.  No HSM or HJR Distal pulses intact with no bruits No edema Neuro non-focal Skin warm and dry No muscular weakness Right radial A   Lab Results: Basic Metabolic Panel:  Recent Labs  11/91/4705/17/17 0915  NA 140  K 4.1  CL 100*  CO2 26  GLUCOSE 106*  BUN 16  CREATININE 1.12  CALCIUM 9.6   Liver Function Tests: No results for input(s): AST, ALT, ALKPHOS, BILITOT, PROT, ALBUMIN in the last 72 hours. No results for input(s): LIPASE, AMYLASE in the last 72 hours. CBC:  Recent Labs  10/25/15 0915 10/26/15 0643  WBC 7.3 6.2  NEUTROABS 5.0  --   HGB 13.7 12.8*  HCT 40.9 38.5*  MCV 89.1 88.5  PLT 221 177   Cardiac Enzymes:  Recent Labs  10/25/15 0915 10/25/15 1805 10/25/15 2352  TROPONINI <0.03 <0.03 <0.03   BNP: Invalid input(s): POCBNP D-Dimer:  Recent Labs  10/25/15 1349  DDIMER <0.27   Hemoglobin A1C: No results for input(s): HGBA1C in the last 72 hours. Fasting Lipid Panel: No results for input(s):  CHOL, HDL, LDLCALC, TRIG, CHOLHDL, LDLDIRECT in the last 72 hours. Thyroid Function Tests: No results for input(s): TSH, T4TOTAL, T3FREE, THYROIDAB in the last 72 hours.  Invalid input(s): FREET3 Anemia Panel: No results for input(s): VITAMINB12, FOLATE, FERRITIN, TIBC, IRON, RETICCTPCT in the last 72 hours.  Imaging: Dg Chest Portable 1 View  10/25/2015  CLINICAL DATA:  Chest pain beginning this morning. No known injury. History of coronary artery stent placement. Initial encounter. EXAM: PORTABLE CHEST 1 VIEW COMPARISON:  PA and lateral chest 09/30/2015 and 05/04/2015. FINDINGS: The lungs are clear. Heart size is normal. No pneumothorax or pleural effusion. No focal bony abnormality. IMPRESSION: Negative chest. Electronically Signed   By: Drusilla Kannerhomas  Dalessio M.D.   On: 10/25/2015 09:47    Cardiac Studies:  ECG:  NsR no acute ST changes    Telemetry:  NSR   Echo: pending   Medications:   . aspirin  324 mg Oral NOW   Or  . aspirin  300 mg Rectal NOW  . aspirin  81 mg Oral Pre-Cath  . aspirin  81 mg Oral Daily  . aspirin EC  81 mg Oral Daily  . atorvastatin  80 mg Oral q1800  . fluticasone  1  spray Each Nare Daily  . isosorbide mononitrate  15 mg Oral Daily  . loratadine  10 mg Oral q1800  . metoprolol tartrate  25 mg Oral BID  . pantoprazole  40 mg Oral Q0600  . sodium chloride flush  3 mL Intravenous Q12H  . sodium chloride flush  3 mL Intravenous Q12H  . sodium chloride flush  3 mL Intravenous Q12H  . ticagrelor  90 mg Oral BID  . tiZANidine  2 mg Oral QHS     . sodium chloride      Assessment/Plan:   CAD:  Cath with patent stents to RCA and LAD/Diagonal Previous spiral dissection. CXR with no CHF and no CE.  R/O no signs of pericarditis  Will get echo this am and if no effusion ok to d/c home Continue medical Rx Ok to go back to work   [employer] 10/26/2015, 7:09 AM

## 2015-10-26 NOTE — Progress Notes (Signed)
  Echocardiogram 2D Echocardiogram has been performed.  Arvil ChacoFoster, Careena Degraffenreid 10/26/2015, 12:58 PM

## 2015-10-26 NOTE — Discharge Summary (Signed)
Discharge Summary    Patient ID: Timothy Charles,  MRN: [id number], DOB/AGE: [birth year] 45 y.o.  Admit date: 10/25/2015 Discharge date: 10/26/2015  Primary Care Provider: Paulino Rily Primary Cardiologist: Dr. Katrinka Blazing   Discharge Diagnoses    Principal Problem:   Chest pain Active Problems:   HLD (hyperlipidemia)   Essential hypertension   Coronary artery disease   Allergies Allergies  Allergen Reactions  . Bee Venom Shortness Of Breath     History of Present Illness     Timothy Charles is a 45 y.o. male with a history of HLD, HTN, CAD s/p DES to RCA and staged DES to LAD c/b spiral dissection of diag (09/2015) who presented to the [facility] ER 10/25/15 with chest pain.   He was admitted 4/22-4/29/17 with NSTEMI and possibly evolving STEMI. He was taken to the cath lab on 04/23 which showed 80% LAD, 70% ramus, 100% RCA. He underwent PCI/DES to the RCA with staged PCI to the LAD planned. His EF was 40-45%. He was started on ASA, Brilinta, Lipitor 80 mg, and low-dose metoprolol was added as his BP would allow. He was tried on low-dose lisinopril, but his BP would not tolerate both ACE/BB so the BB was continued alone. 2D ECHO on 10/02/15 showed EF 55-60% with no RWMAs. He was taken back to the cath lab on 04/25. He had successful intervention on the LAD, but spiral dissection of a diagonal branch was seen. The images were reviewed carefully by Dr Katrinka Blazing and med rx was recommended. His troponin did bump post procedure. He was managed with pain medications and nitrates. He was converted to Imdur once his symptoms were controlled.   He was seen by Theodore Demark PA-C in the office on 10/13/15. He complained of SOB felt to be related to Brilinta and HA and imdur was decreased. He was not sleeping well.   He represented to [facility] on 10/25/15 for chest pain and continued SOB. He ruled out for MI but his symptoms were concerning for [place] and he was admitted for coronary angiography and observation.    He underwent cath on 10/25/15 which showed patent stents to RCA and LAD/Diagonal Previous spiral dissection. CXR with no CHF and no elevation of CE.2D ECHO with no pericardial effusion. He was discharged home in good condition.   Hospital Course     Consultants: none   Chest pain worrisome for [place]: cath with patent stents to RCA and LAD/Diagonal Previous spiral dissection. CXR with no CHF and no elevation of CE.2D ECHO with no pericardial effusion. If he continues to have SOB with Brilinta after 1 month, we can consider switching him to plavix. Continue ASA, BB and statin.  HTN: BP well controlled  HLD: LDL unable to be calculated due to high TG however, patient was not fasting and ate at 2am before 5 am lipid panel. Continue high intensity statin.  Pre-diabetes: HgA1c 5.8. Diet and exercise recommended    The patient has had an uncomplicated hospital course and is recovering well. The radial catheter site is stable. He has been seen by Dr. Eden Emms today and deemed ready for discharge home.  Discharge medications are listed below.  _____________  Discharge Vitals Blood pressure 139/76, pulse 69, temperature 97.7 F (36.5 C), temperature source Oral, resp. rate 17, height  (1.803 m), weight 216 lb 0.8 oz (98 kg), SpO2 99 %.  Filed Weights   10/25/15 0918 10/26/15 0430  Weight: 218 lb (98.884 kg) 216 lb 0.8 oz (  98 kg)    Labs & Radiologic Studies     CBC  Recent Labs  10/25/15 0915 10/26/15 0643  WBC 7.3 6.2  NEUTROABS 5.0  --   HGB 13.7 12.8*  HCT 40.9 38.5*  MCV 89.1 88.5  PLT 221 177   Basic Metabolic Panel  Recent Labs  10/25/15 0915 10/26/15 0643  NA 140 140  K 4.1 3.9  CL 100* 102  CO2 26 26  GLUCOSE 106* 110*  BUN 16 13  CREATININE 1.12 1.08  CALCIUM 9.6 8.8*   Liver Function Tests No results for input(s): AST, ALT, ALKPHOS, BILITOT, PROT, ALBUMIN in the last 72 hours. No results for input(s): LIPASE, AMYLASE in the last 72 hours. Cardiac  Enzymes  Recent Labs  10/25/15 1805 10/25/15 2352 10/26/15 0643  TROPONINI <0.03 <0.03 <0.03   BNP Invalid input(s): POCBNP D-Dimer  Recent Labs  10/25/15 1349  DDIMER <0.27   Hemoglobin A1C No results for input(s): HGBA1C in the last 72 hours. Fasting Lipid Panel  Recent Labs  10/26/15 0643  CHOL 128  HDL 21*  LDLCALC UNABLE TO CALCULATE IF TRIGLYCERIDE OVER 400 mg/dL  TRIG 161420*  CHOLHDL 6.1   Thyroid Function Tests No results for input(s): TSH, T4TOTAL, T3FREE, THYROIDAB in the last 72 hours.  Invalid input(s): FREET3  Dg Chest 2 View  09/30/2015  CLINICAL DATA:  LEFT-sided chest pain. EXAM: CHEST  2 VIEW COMPARISON:  05/04/2015 FINDINGS: Normal mediastinum and cardiac silhouette. Normal pulmonary vasculature. No evidence of effusion, infiltrate, or pneumothorax. No acute bony abnormality. IMPRESSION: No acute cardiopulmonary process. Electronically Signed   By: Genevive BiStewart  Edmunds M.D.   On: 09/30/2015 17:49   Dg Chest Portable 1 View  10/25/2015  CLINICAL DATA:  Chest pain beginning this morning. No known injury. History of coronary artery stent placement. Initial encounter. EXAM: PORTABLE CHEST 1 VIEW COMPARISON:  PA and lateral chest 09/30/2015 and 05/04/2015. FINDINGS: The lungs are clear. Heart size is normal. No pneumothorax or pleural effusion. No focal bony abnormality. IMPRESSION: Negative chest. Electronically Signed   By: Drusilla Kannerhomas  Dalessio M.D.   On: 10/25/2015 09:47     Diagnostic Studies/Procedures    10/25/15 Procedures    Left Heart Cath and Cors/Grafts Angiography    Conclusion    1. 1st Diag lesion, 40% stenosed. 2. Ost 1st Diag to 1st Diag lesion, 50% stenosed. 3. The left ventricular systolic function is normal. 4. Ost Ramus to Ramus lesion, 50% stenosed.   Widely patent RCA stent placed in April 2017 during acute inferior wall STEMI.  Widely patent LAD stent in the proximal to mid LAD. The jailed first diagonal branch that was treated  with angioplasty post-stenting due to acute occlusion reveals a persistent mid vessel dissection and eccentric ostial 50% narrowing. The appearance of the diagonal is improved compared to post PCI. TIMI grade 3 flow is noted throughout the LAD and diagonal. No interventional treatment is needed on the diagonal at this time.  50% proximal ramus intermedius stenosis.  Overall normal LV function with a focal region of mid anterior wall hypokinesis, presumably in the distribution of the jailed diagonal.. EF 55%. Normal LV hemodynamics.  Persisting dull chest pressure of uncertain etiology. Possibly still related to the diagonal dissection.  RECOMMENDATIONS:   Observation overnight. If no enzyme bump or other symptoms, consider discharge.  Repeat echocardiogram to exclude the possibility of pericardial effusion.     _____________  2D ECHO: 10/26/2015 LV EF: 60% - 65% Study Conclusions - Left  ventricle: The cavity size was normal. Wall thickness was  increased in a pattern of mild LVH. Systolic function was normal.  The estimated ejection fraction was in the range of 60% to 65%.  Wall motion was normal; there were no regional wall motion  abnormalities.   Disposition   Pt is being discharged home today in good condition.  Follow-up Plans & Appointments    Follow-up Information    Follow up with Cline Crock, PA-C On 11/07/2015.   Specialties:  Cardiology, Radiology   Why:  @ 8:30am    Contact information:   [address] [place] [address] [phone]        Discharge Medications   Current Discharge Medication List    CONTINUE these medications which have NOT CHANGED   Details  aspirin 81 MG chewable tablet Chew 1 tablet (81 mg total) by mouth daily.    atorvastatin (LIPITOR) 80 MG tablet Take 1 tablet (80 mg total) by mouth daily at 6 PM. Qty: 30 tablet, Refills: 11    fluticasone (FLONASE) 50 MCG/ACT nasal spray Place 1 spray  into both nostrils daily.    HYDROcodone-acetaminophen (NORCO/VICODIN) 5-325 MG tablet Take 1 tablet by mouth every 6 (six) hours as needed for moderate pain or severe pain.    isosorbide mononitrate (IMDUR) 30 MG 24 hr tablet Take 0.5 tablets (15 mg total) by mouth daily. Qty: 30 tablet, Refills: 11    levocetirizine (XYZAL) 5 MG tablet Take 5 mg by mouth every evening.    metoprolol tartrate (LOPRESSOR) 25 MG tablet Take 1 tablet (25 mg total) by mouth 2 (two) times daily. Qty: 60 tablet, Refills: 11    nitroGLYCERIN (NITROSTAT) 0.4 MG SL tablet Place 1 tablet (0.4 mg total) under the tongue every 5 (five) minutes as needed for chest pain. Qty: 25 tablet, Refills: 12    pantoprazole (PROTONIX) 40 MG tablet Take 1 tablet (40 mg total) by mouth daily at 6 (six) AM. Qty: 30 tablet, Refills: 3    ticagrelor (BRILINTA) 90 MG TABS tablet Take 1 tablet (90 mg total) by mouth 2 (two) times daily. Qty: 60 tablet, Refills: 11    tiZANidine (ZANAFLEX) 2 MG tablet Take 2 mg by mouth at bedtime.           Outstanding Labs/Studies   None   Duration of Discharge Encounter   Greater than 30 minutes including physician time.  Signed, Cline Crock PA-C 10/26/2015, 1:53 PM

## 2015-11-04 NOTE — Progress Notes (Signed)
  ROS  This encounter was created in error - please disregard. 

## 2015-11-07 ENCOUNTER — Encounter: Payer: BLUE CROSS/BLUE SHIELD | Admitting: Physician Assistant

## 2015-11-28 ENCOUNTER — Telehealth: Payer: Self-pay | Admitting: Cardiovascular Disease

## 2015-11-28 NOTE — Telephone Encounter (Signed)
Records faxed to - [facility]-confirmed all numbers And physician.

## 2015-11-28 NOTE — Telephone Encounter (Signed)
New message    The Dr. In ER was needing the last 3 cardac cath reports please fax them to [phone], the pt is in the Er room they need the information

## 2015-12-02 ENCOUNTER — Other Ambulatory Visit: Payer: Self-pay | Admitting: Physician Assistant

## 2015-12-04 ENCOUNTER — Encounter: Payer: Self-pay | Admitting: Physician Assistant

## 2016-01-16 ENCOUNTER — Ambulatory Visit: Payer: BLUE CROSS/BLUE SHIELD | Admitting: Interventional Cardiology

## 2016-01-16 ENCOUNTER — Other Ambulatory Visit: Payer: BLUE CROSS/BLUE SHIELD

## 2016-01-23 ENCOUNTER — Encounter (HOSPITAL_COMMUNITY): Payer: Self-pay | Admitting: *Deleted

## 2016-01-26 ENCOUNTER — Other Ambulatory Visit: Payer: Self-pay | Admitting: Physician Assistant

## 2017-09-04 IMAGING — DX DG CHEST 2V
2 series · 2 of 2 positions shown · non-contrast
Comparison: Chest radiograph performed 11/23/2013

CLINICAL DATA: Acute onset of cough and runny nose. Initial
encounter.

EXAM:
CHEST  2 VIEW

[chest pa]
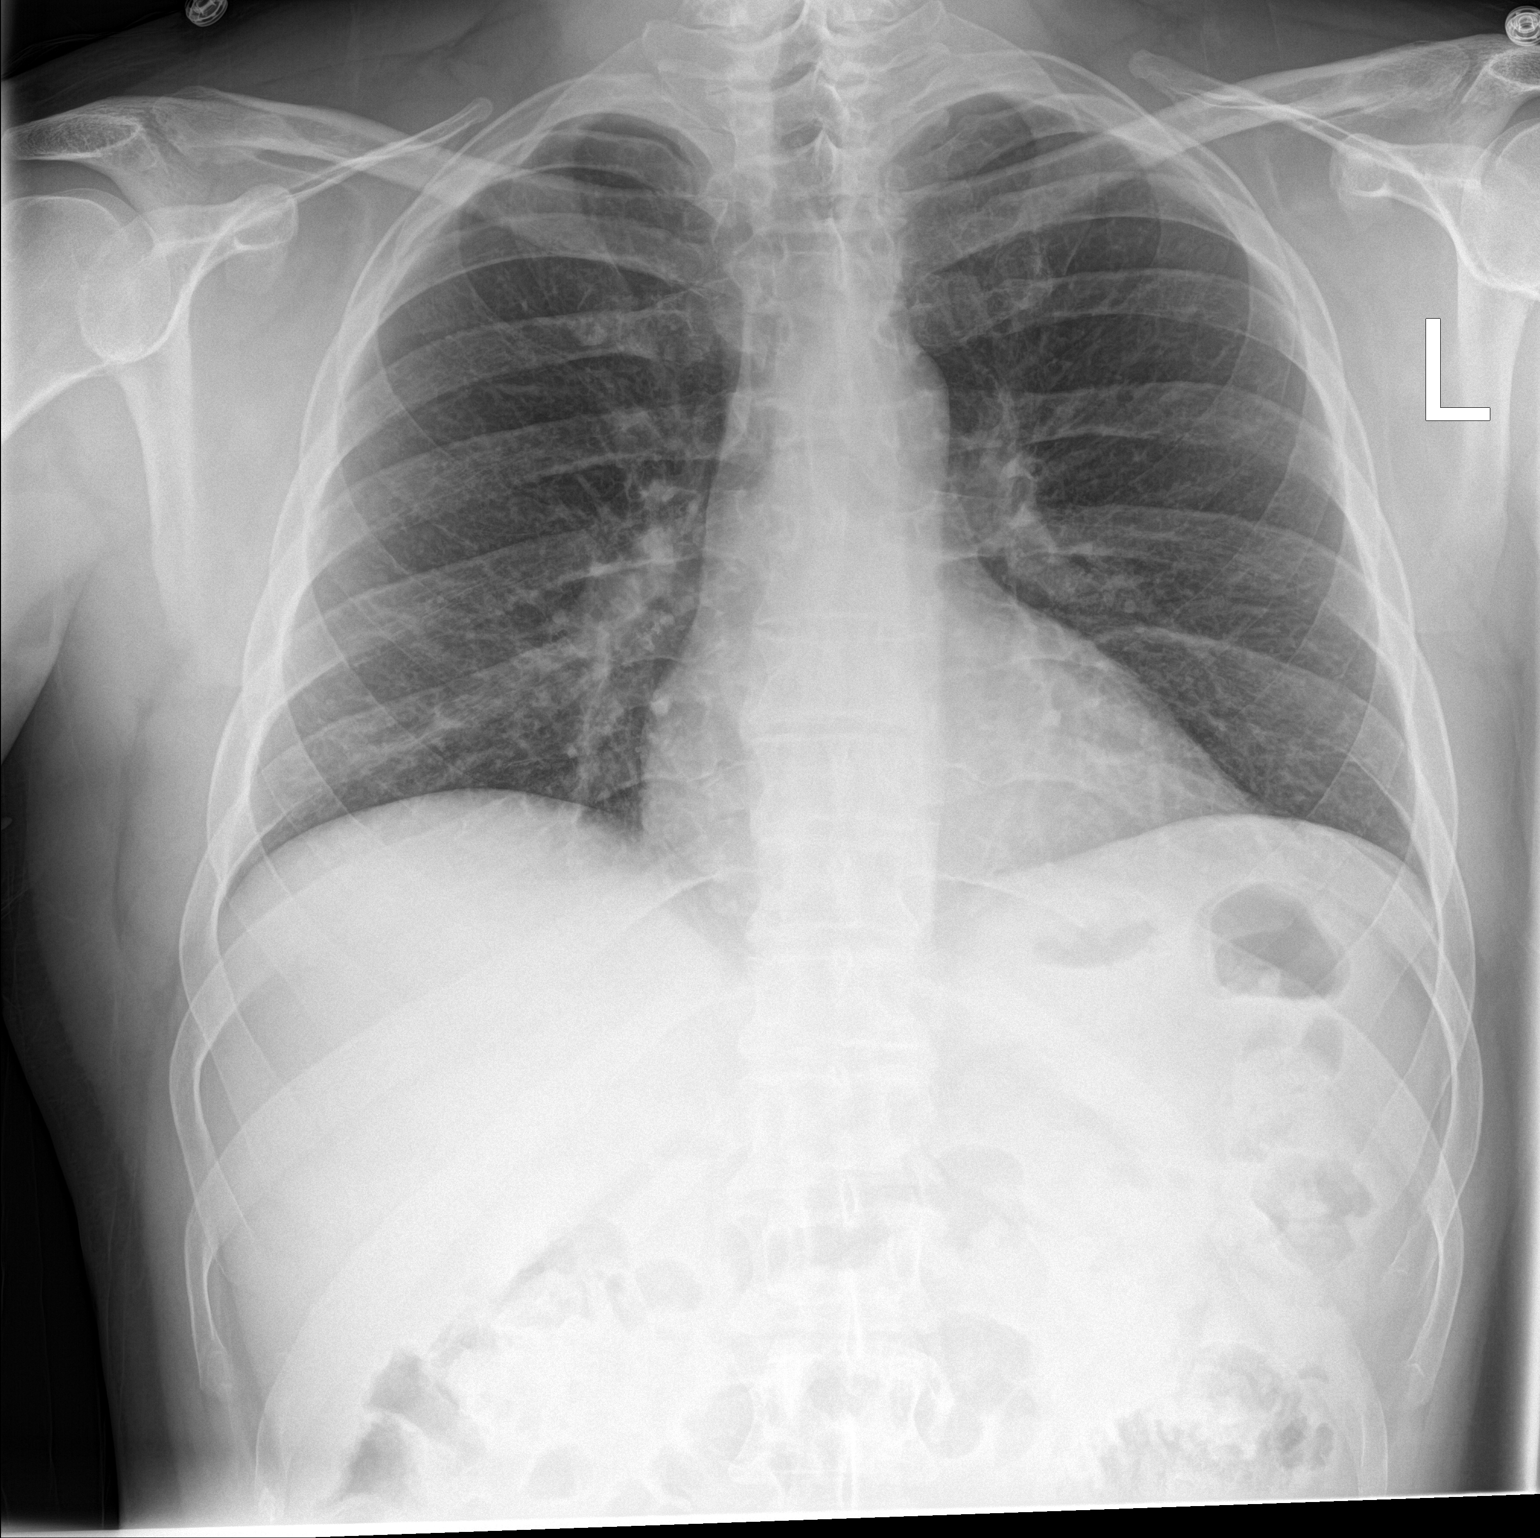

[chest lat]
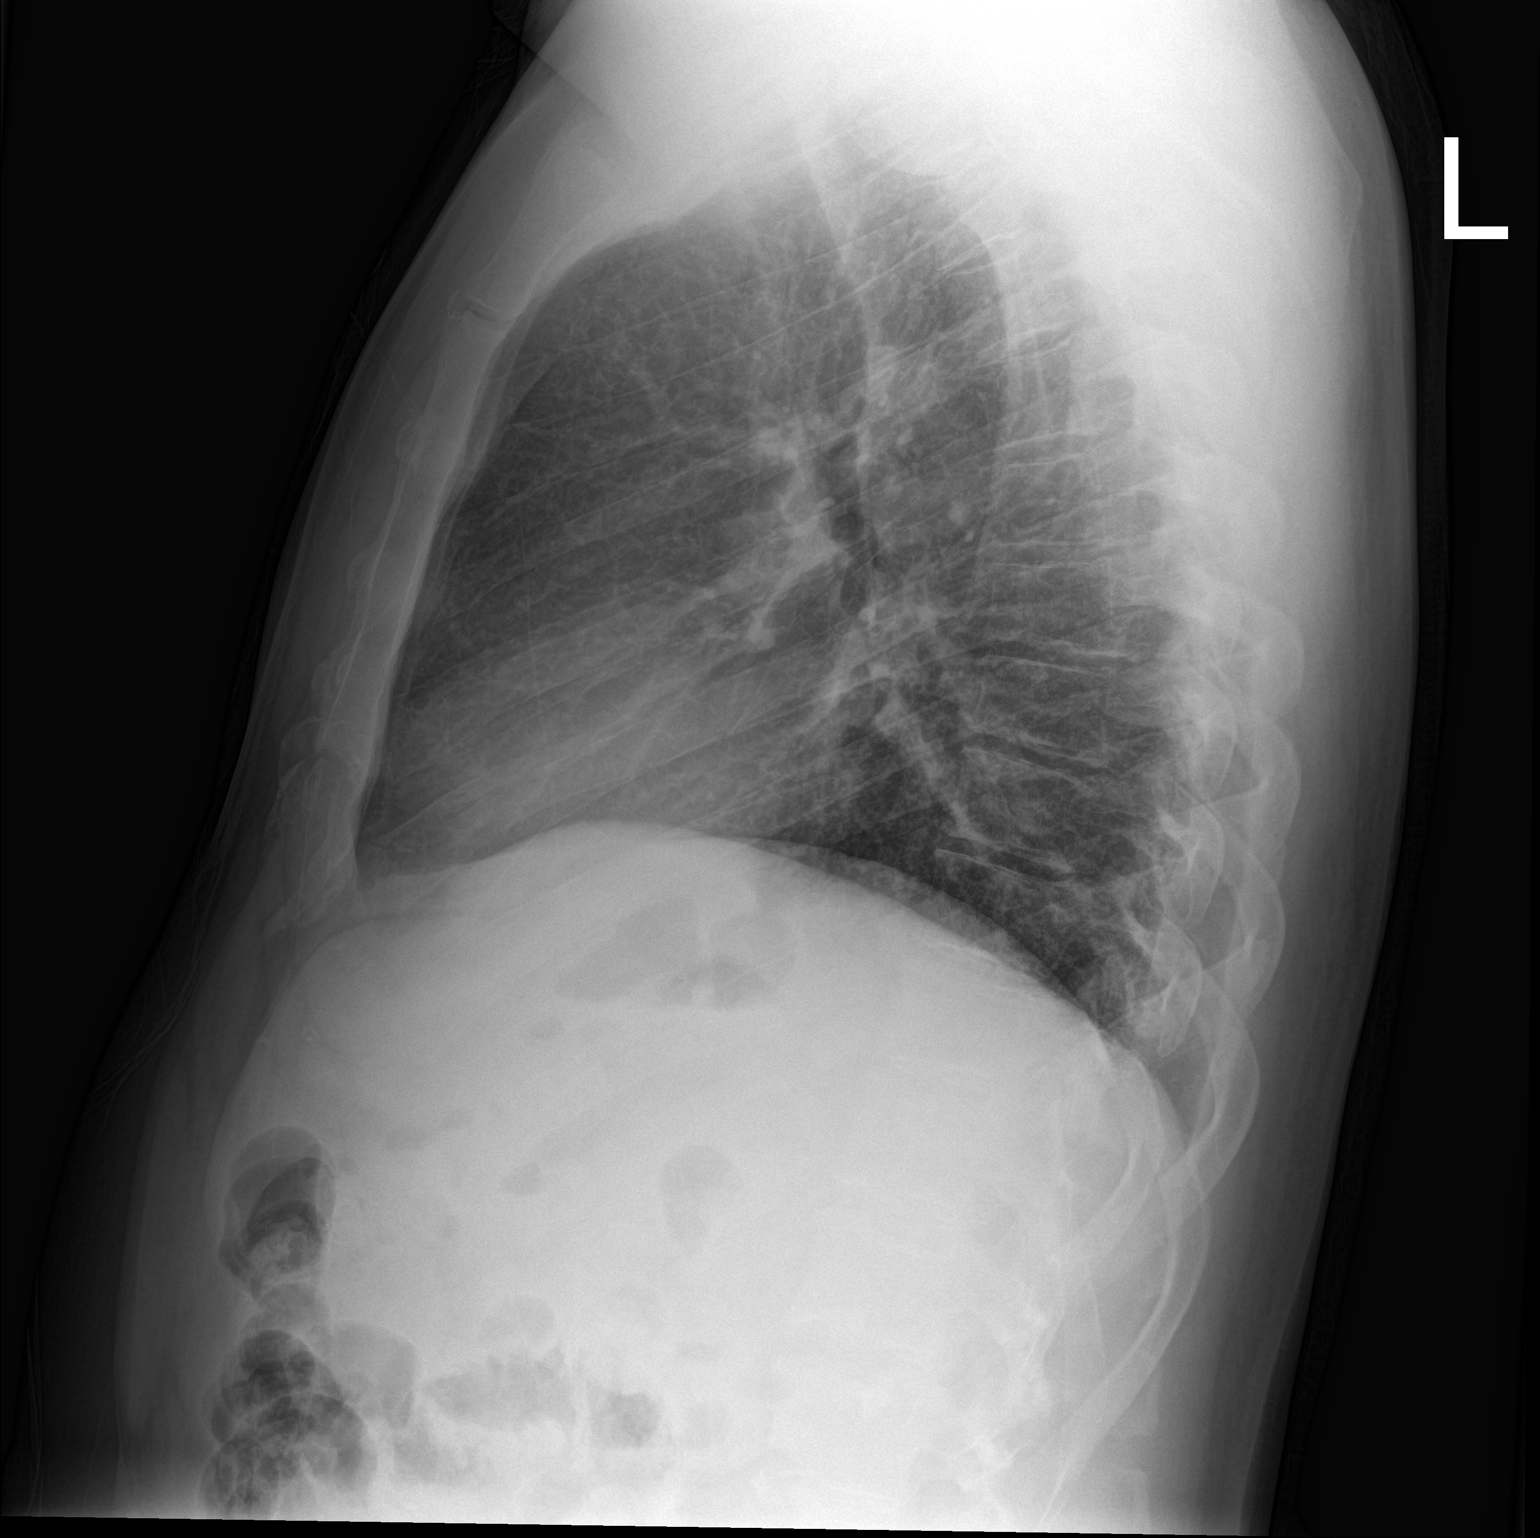

[2 of 2 positions shown; findings below may reference images not displayed]

FINDINGS: The lungs are well-aerated. Mild peribronchial thickening is noted.
There is no evidence of focal opacification, pleural effusion or
pneumothorax.

The heart is normal in size; the mediastinal contour is within
normal limits. No acute osseous abnormalities are seen.
IMPRESSION: Mild peribronchial thickening noted.  Lungs otherwise clear

## 2018-01-31 IMAGING — CR DG CHEST 2V
2 series · 2 of 2 positions shown · non-contrast
Comparison: 05/04/2015

CLINICAL DATA: LEFT-sided chest pain.

EXAM:
CHEST  2 VIEW

[w chest pa]
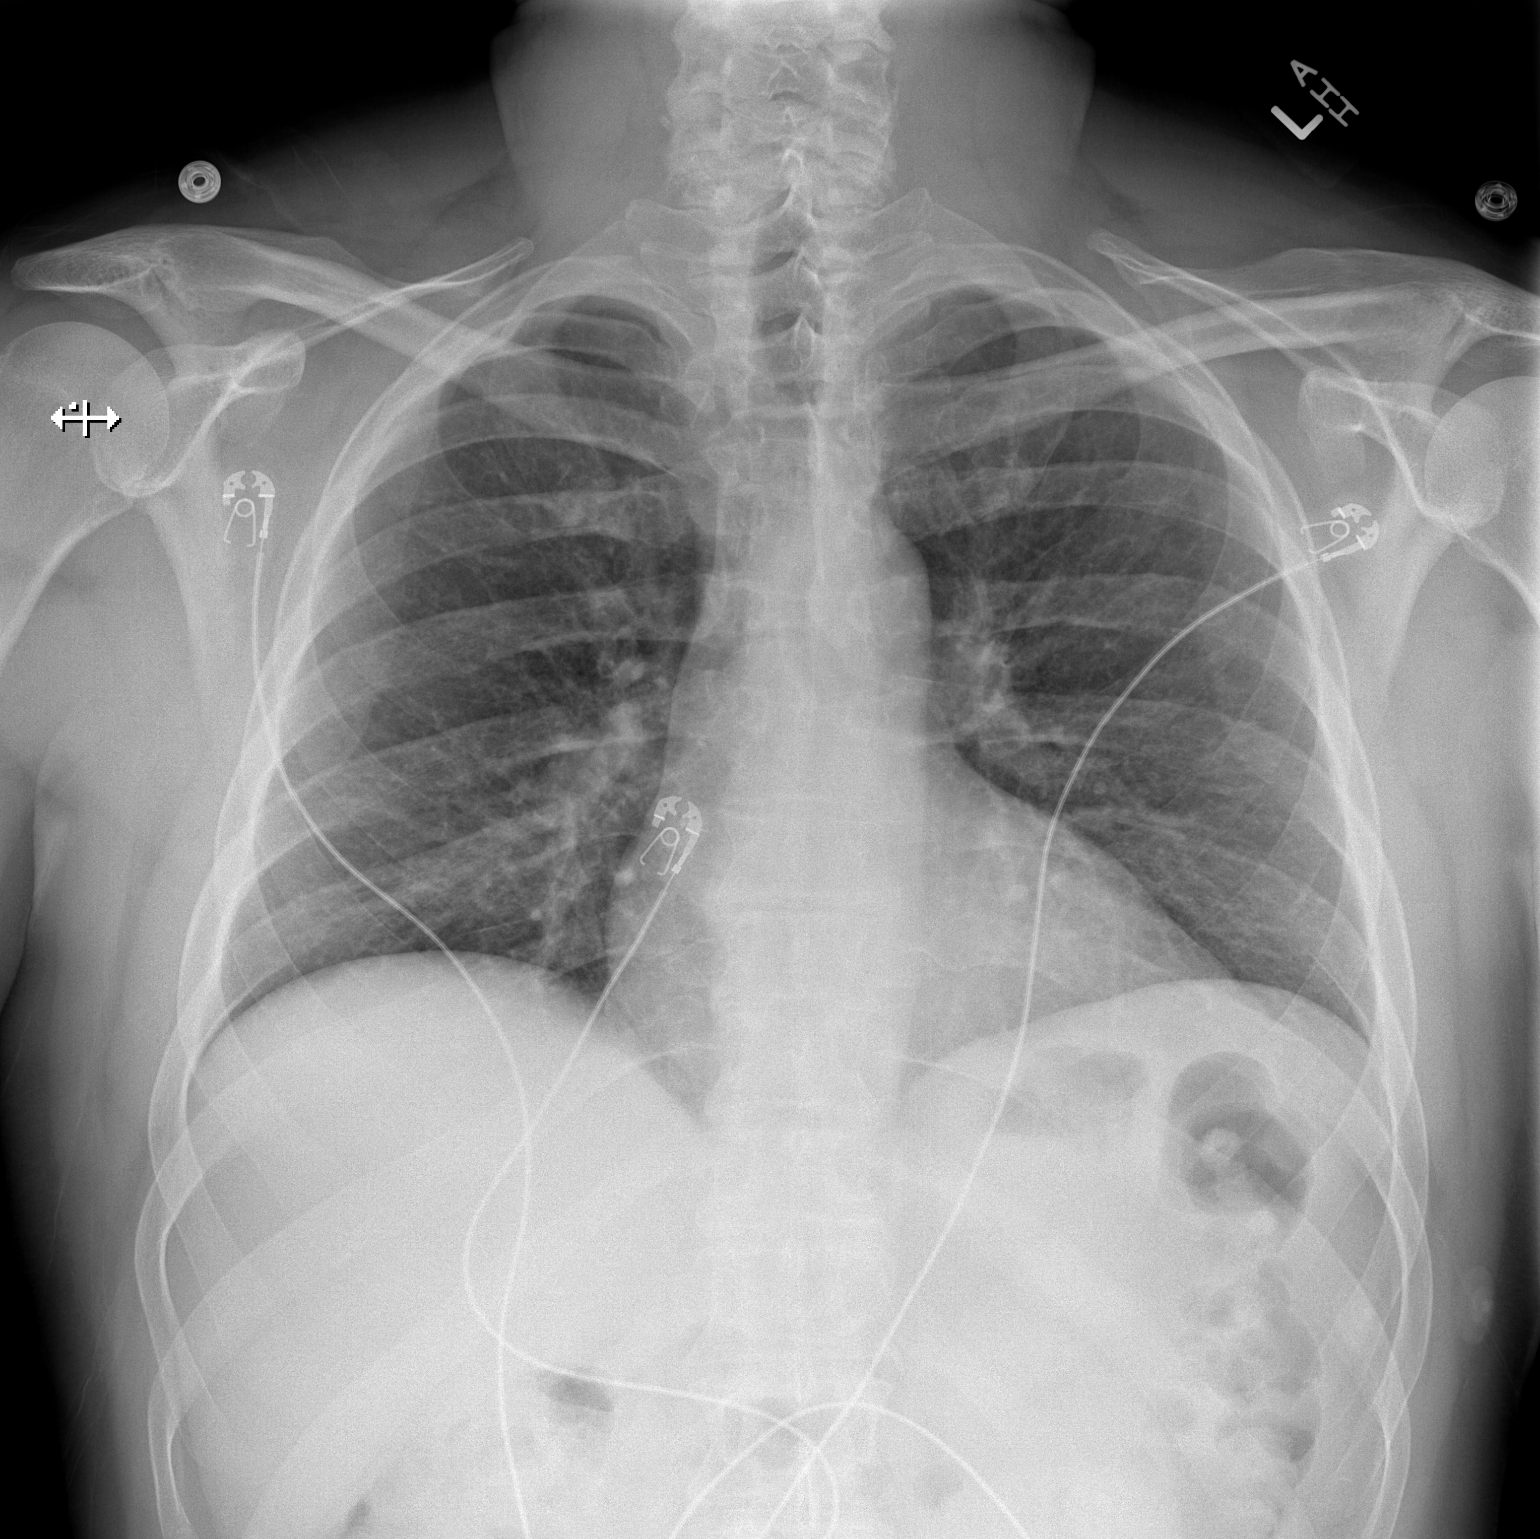

[w chest lat]
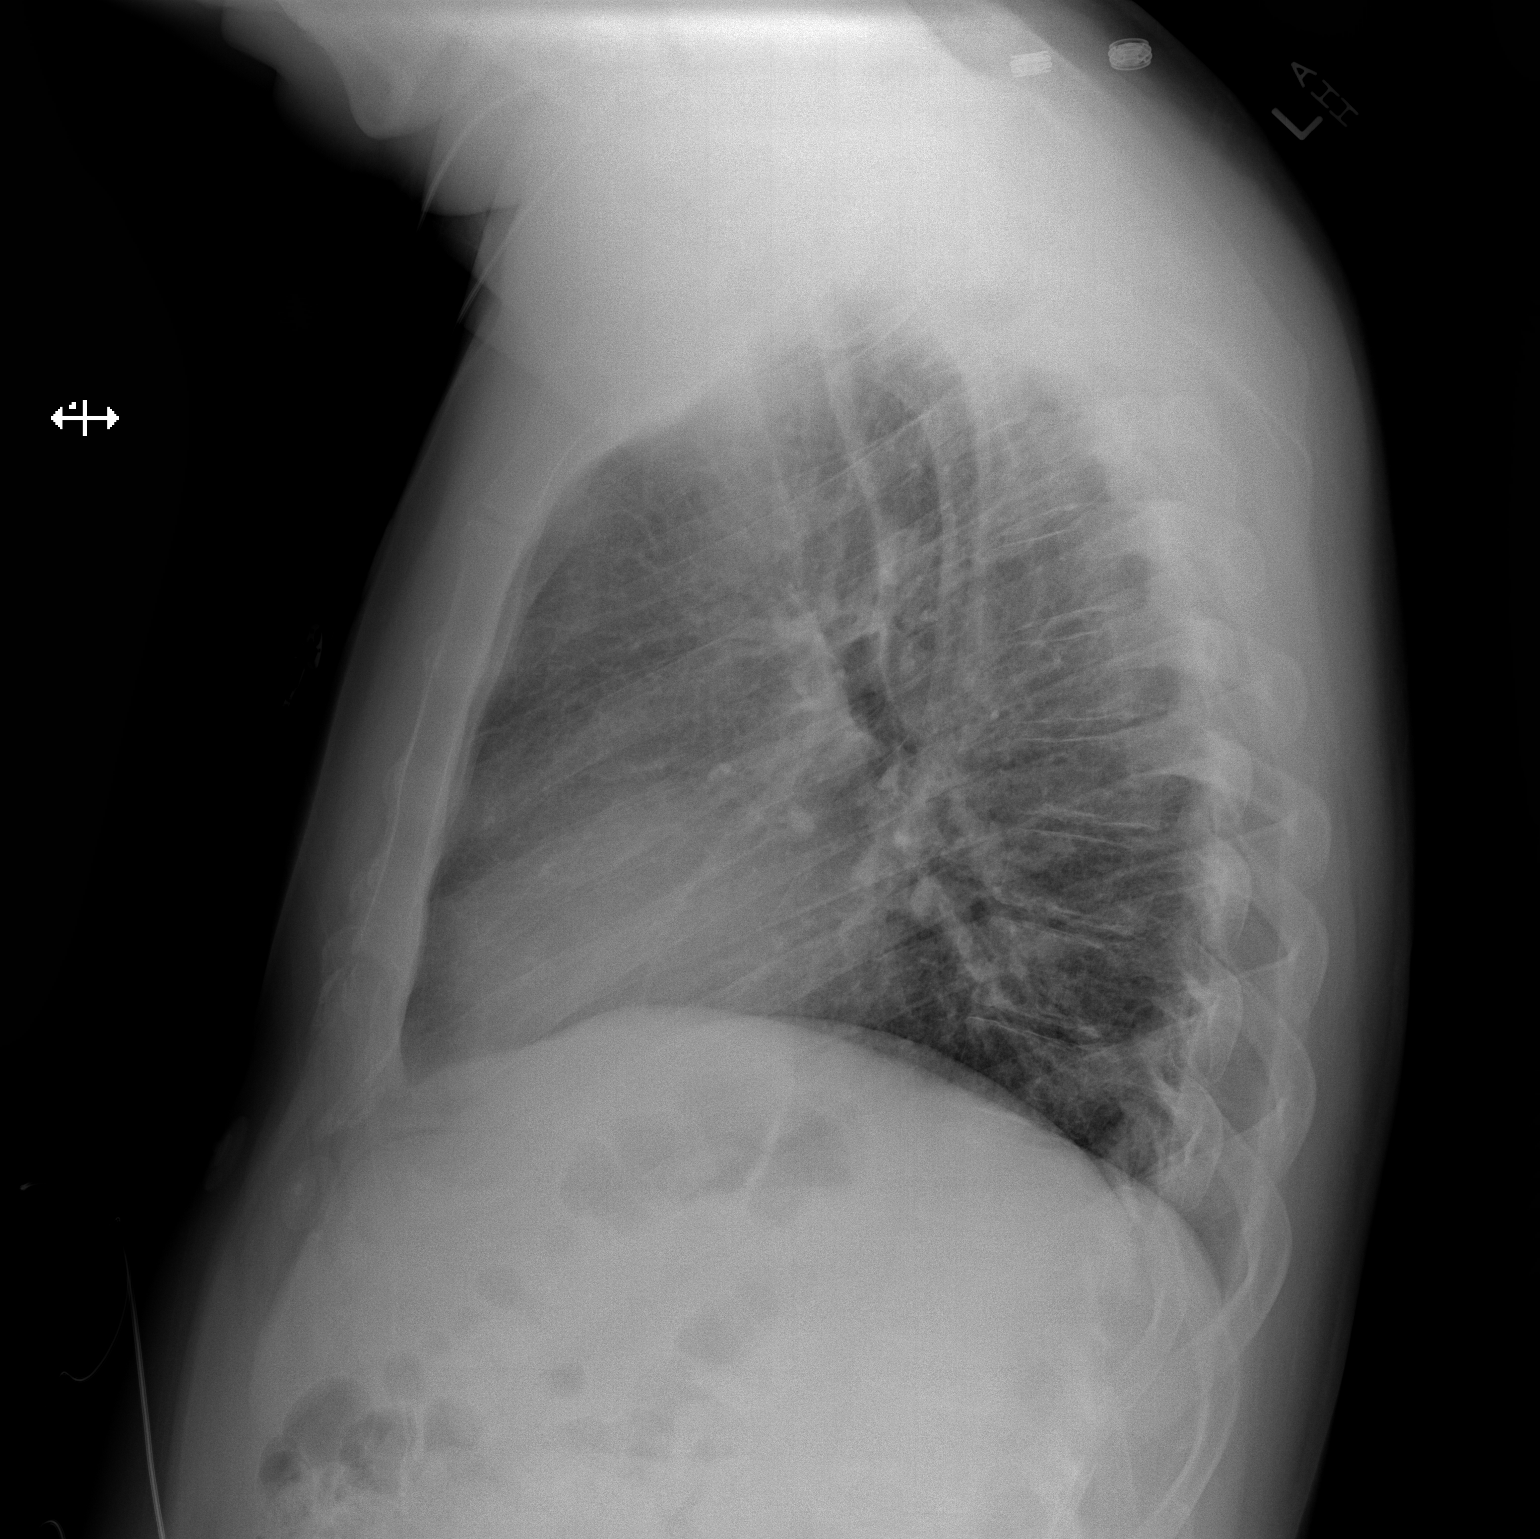

[2 of 2 positions shown; findings below may reference images not displayed]

FINDINGS: Normal mediastinum and cardiac silhouette. Normal pulmonary
vasculature. No evidence of effusion, infiltrate, or pneumothorax.
No acute bony abnormality.
IMPRESSION: No acute cardiopulmonary process.
